# Patient Record
Sex: Female | Born: 1940 | Race: White | Hispanic: No | Smoking: Former smoker
Health system: Southern US, Community
[De-identification: ages and names within clinical notes are randomized; demographics above are authoritative.]

## PROBLEM LIST (undated history)

## (undated) DIAGNOSIS — J45909 Unspecified asthma, uncomplicated: Secondary | ICD-10-CM

## (undated) DIAGNOSIS — F419 Anxiety disorder, unspecified: Secondary | ICD-10-CM

## (undated) DIAGNOSIS — I519 Heart disease, unspecified: Secondary | ICD-10-CM

## (undated) DIAGNOSIS — T7840XA Allergy, unspecified, initial encounter: Secondary | ICD-10-CM

## (undated) DIAGNOSIS — H9319 Tinnitus, unspecified ear: Secondary | ICD-10-CM

## (undated) DIAGNOSIS — I999 Unspecified disorder of circulatory system: Secondary | ICD-10-CM

## (undated) DIAGNOSIS — I10 Essential (primary) hypertension: Secondary | ICD-10-CM

## (undated) DIAGNOSIS — G459 Transient cerebral ischemic attack, unspecified: Secondary | ICD-10-CM

## (undated) DIAGNOSIS — I4891 Unspecified atrial fibrillation: Secondary | ICD-10-CM

## (undated) DIAGNOSIS — M1990 Unspecified osteoarthritis, unspecified site: Secondary | ICD-10-CM

## (undated) HISTORY — DX: Unspecified disorder of circulatory system: I99.9

## (undated) HISTORY — DX: Heart disease, unspecified: I51.9

## (undated) HISTORY — DX: Unspecified asthma, uncomplicated: J45.909

## (undated) HISTORY — DX: Allergy, unspecified, initial encounter: T78.40XA

## (undated) HISTORY — DX: Tinnitus, unspecified ear: H93.19

## (undated) HISTORY — DX: Unspecified osteoarthritis, unspecified site: M19.90

## (undated) HISTORY — PX: VAGINAL HYSTERECTOMY: SUR661

## (undated) HISTORY — DX: Anxiety disorder, unspecified: F41.9

## (undated) HISTORY — DX: Unspecified atrial fibrillation: I48.91

---

## 2004-08-27 ENCOUNTER — Ambulatory Visit: Payer: Self-pay | Admitting: Family Medicine

## 2005-08-31 ENCOUNTER — Other Ambulatory Visit: Payer: Self-pay

## 2005-08-31 ENCOUNTER — Inpatient Hospital Stay: Payer: Self-pay | Admitting: Internal Medicine

## 2005-09-01 ENCOUNTER — Other Ambulatory Visit: Payer: Self-pay

## 2005-09-02 ENCOUNTER — Other Ambulatory Visit: Payer: Self-pay

## 2005-09-03 ENCOUNTER — Other Ambulatory Visit: Payer: Self-pay

## 2006-10-28 HISTORY — PX: OTHER SURGICAL HISTORY: SHX169

## 2009-12-28 ENCOUNTER — Ambulatory Visit: Payer: Self-pay | Admitting: Unknown Physician Specialty

## 2011-05-20 DIAGNOSIS — E785 Hyperlipidemia, unspecified: Secondary | ICD-10-CM | POA: Insufficient documentation

## 2011-05-20 DIAGNOSIS — E782 Mixed hyperlipidemia: Secondary | ICD-10-CM | POA: Insufficient documentation

## 2011-05-20 DIAGNOSIS — G459 Transient cerebral ischemic attack, unspecified: Secondary | ICD-10-CM | POA: Insufficient documentation

## 2011-05-20 DIAGNOSIS — I10 Essential (primary) hypertension: Secondary | ICD-10-CM | POA: Insufficient documentation

## 2011-05-20 DIAGNOSIS — I878 Other specified disorders of veins: Secondary | ICD-10-CM | POA: Insufficient documentation

## 2011-05-20 DIAGNOSIS — M8580 Other specified disorders of bone density and structure, unspecified site: Secondary | ICD-10-CM | POA: Insufficient documentation

## 2012-05-28 DEATH — deceased

## 2012-09-08 ENCOUNTER — Ambulatory Visit: Payer: Self-pay | Admitting: Unknown Physician Specialty

## 2012-09-14 ENCOUNTER — Ambulatory Visit: Payer: Self-pay | Admitting: Unknown Physician Specialty

## 2012-09-16 ENCOUNTER — Ambulatory Visit: Payer: Self-pay | Admitting: Unknown Physician Specialty

## 2013-09-21 ENCOUNTER — Ambulatory Visit: Payer: Self-pay | Admitting: Family Medicine

## 2013-10-19 ENCOUNTER — Ambulatory Visit: Payer: Self-pay | Admitting: Family Medicine

## 2014-05-25 DIAGNOSIS — I639 Cerebral infarction, unspecified: Secondary | ICD-10-CM | POA: Insufficient documentation

## 2014-05-30 ENCOUNTER — Ambulatory Visit: Payer: Self-pay | Admitting: Physician Assistant

## 2014-05-30 ENCOUNTER — Emergency Department: Payer: Self-pay | Admitting: Emergency Medicine

## 2014-05-30 LAB — URINALYSIS, COMPLETE
BACTERIA: NONE SEEN
BILIRUBIN, UR: NEGATIVE
BILIRUBIN, UR: NEGATIVE
BLOOD: NEGATIVE
GLUCOSE, UR: NEGATIVE mg/dL (ref 0–75)
Glucose,UR: NEGATIVE mg/dL (ref 0–75)
Ketone: NEGATIVE
Ketone: NEGATIVE
LEUKOCYTE ESTERASE: NEGATIVE
LEUKOCYTE ESTERASE: NEGATIVE
Nitrite: NEGATIVE
Nitrite: NEGATIVE
PH: 8 (ref 4.5–8.0)
Ph: 7 (ref 4.5–8.0)
Protein: 30
Protein: NEGATIVE
RBC,UR: 4 /HPF (ref 0–5)
SPECIFIC GRAVITY: 1.015 (ref 1.003–1.030)
Specific Gravity: 1.005 (ref 1.003–1.030)
WBC UR: <1 /HPF (ref 0–5)

## 2014-05-30 LAB — CBC
HCT: 48.6 % — ABNORMAL HIGH (ref 35.0–47.0)
HGB: 15.7 g/dL (ref 12.0–16.0)
MCH: 29 pg (ref 26.0–34.0)
MCHC: 32.2 g/dL (ref 32.0–36.0)
MCV: 90 fL (ref 80–100)
Platelet: 275 10*3/uL (ref 150–440)
RBC: 5.4 10*6/uL — AB (ref 3.80–5.20)
RDW: 13.5 % (ref 11.5–14.5)
WBC: 7.2 10*3/uL (ref 3.6–11.0)

## 2014-05-30 LAB — COMPREHENSIVE METABOLIC PANEL
ALK PHOS: 64 U/L
ANION GAP: 7 (ref 7–16)
Albumin: 3.6 g/dL (ref 3.4–5.0)
BILIRUBIN TOTAL: 0.4 mg/dL (ref 0.2–1.0)
BUN: 14 mg/dL (ref 7–18)
CALCIUM: 8.8 mg/dL (ref 8.5–10.1)
CHLORIDE: 105 mmol/L (ref 98–107)
CREATININE: 0.54 mg/dL — AB (ref 0.60–1.30)
Co2: 29 mmol/L (ref 21–32)
EGFR (African American): >60
Glucose: 93 mg/dL (ref 65–99)
Osmolality: 281 (ref 275–301)
POTASSIUM: 3.6 mmol/L (ref 3.5–5.1)
SGOT(AST): 23 U/L (ref 15–37)
SGPT (ALT): 30 U/L
Sodium: 141 mmol/L (ref 136–145)
TOTAL PROTEIN: 7.8 g/dL (ref 6.4–8.2)

## 2014-05-30 LAB — TROPONIN I: Troponin-I: <0.02 ng/mL

## 2014-06-01 LAB — URINE CULTURE

## 2014-08-10 DIAGNOSIS — I340 Nonrheumatic mitral (valve) insufficiency: Secondary | ICD-10-CM | POA: Insufficient documentation

## 2014-09-27 ENCOUNTER — Ambulatory Visit: Payer: Self-pay | Admitting: Nurse Practitioner

## 2014-11-29 ENCOUNTER — Ambulatory Visit: Payer: Self-pay

## 2015-01-03 DIAGNOSIS — I272 Pulmonary hypertension, unspecified: Secondary | ICD-10-CM | POA: Insufficient documentation

## 2015-01-03 DIAGNOSIS — I071 Rheumatic tricuspid insufficiency: Secondary | ICD-10-CM | POA: Insufficient documentation

## 2015-05-02 ENCOUNTER — Telehealth: Payer: Self-pay | Admitting: Gastroenterology

## 2015-05-02 NOTE — Telephone Encounter (Signed)
Colonoscopy triage °

## 2015-05-16 NOTE — Telephone Encounter (Signed)
LVM for pt to return my call.

## 2015-05-23 ENCOUNTER — Telehealth: Payer: Self-pay | Admitting: Gastroenterology

## 2015-05-23 NOTE — Telephone Encounter (Signed)
Please call patient regarding an appt or colonoscopy patient is 42 wasn't sure if she needed a colonoscopy and she also thinks she has an appt on 8/10 which she does not

## 2015-05-23 NOTE — Telephone Encounter (Signed)
Phoned pt to let her know about her appt 8-10 @ 8:45 LM on cell number and home number

## 2015-08-09 ENCOUNTER — Emergency Department: Payer: Medicare Other

## 2015-08-09 ENCOUNTER — Emergency Department
Admission: EM | Admit: 2015-08-09 | Discharge: 2015-08-09 | Disposition: A | Discharge status: Home / Self Care | Payer: Medicare Other | Attending: Emergency Medicine | Admitting: Emergency Medicine

## 2015-08-09 DIAGNOSIS — Z87891 Personal history of nicotine dependence: Secondary | ICD-10-CM | POA: Diagnosis not present

## 2015-08-09 DIAGNOSIS — E860 Dehydration: Secondary | ICD-10-CM | POA: Diagnosis not present

## 2015-08-09 DIAGNOSIS — Z79899 Other long term (current) drug therapy: Secondary | ICD-10-CM | POA: Insufficient documentation

## 2015-08-09 DIAGNOSIS — R42 Dizziness and giddiness: Secondary | ICD-10-CM | POA: Insufficient documentation

## 2015-08-09 DIAGNOSIS — I10 Essential (primary) hypertension: Secondary | ICD-10-CM | POA: Insufficient documentation

## 2015-08-09 DIAGNOSIS — R350 Frequency of micturition: Secondary | ICD-10-CM | POA: Diagnosis not present

## 2015-08-09 DIAGNOSIS — Z880 Allergy status to penicillin: Secondary | ICD-10-CM | POA: Diagnosis not present

## 2015-08-09 DIAGNOSIS — R531 Weakness: Secondary | ICD-10-CM | POA: Diagnosis present

## 2015-08-09 HISTORY — DX: Essential (primary) hypertension: I10

## 2015-08-09 HISTORY — DX: Transient cerebral ischemic attack, unspecified: G45.9

## 2015-08-09 LAB — CBC WITH DIFFERENTIAL/PLATELET
BASOS PCT: 1 %
Basophils Absolute: 0.1 10*3/uL (ref 0–0.1)
EOS ABS: 0.2 10*3/uL (ref 0–0.7)
EOS PCT: 3 %
HCT: 46.4 % (ref 35.0–47.0)
HEMOGLOBIN: 15.1 g/dL (ref 12.0–16.0)
Lymphocytes Relative: 22 %
Lymphs Abs: 1.6 10*3/uL (ref 1.0–3.6)
MCH: 28.2 pg (ref 26.0–34.0)
MCHC: 32.5 g/dL (ref 32.0–36.0)
MCV: 86.9 fL (ref 80.0–100.0)
MONOS PCT: 10 %
Monocytes Absolute: 0.7 10*3/uL (ref 0.2–0.9)
Neutro Abs: 4.6 10*3/uL (ref 1.4–6.5)
Neutrophils Relative %: 64 %
PLATELETS: 233 10*3/uL (ref 150–440)
RBC: 5.34 MIL/uL — ABNORMAL HIGH (ref 3.80–5.20)
RDW: 13.8 % (ref 11.5–14.5)
WBC: 7.1 10*3/uL (ref 3.6–11.0)

## 2015-08-09 LAB — BASIC METABOLIC PANEL
Anion gap: 8 (ref 5–15)
BUN: 20 mg/dL (ref 6–20)
CALCIUM: 8.9 mg/dL (ref 8.9–10.3)
CHLORIDE: 106 mmol/L (ref 101–111)
CO2: 25 mmol/L (ref 22–32)
CREATININE: 0.58 mg/dL (ref 0.44–1.00)
Glucose, Bld: 92 mg/dL (ref 65–99)
Potassium: 3.5 mmol/L (ref 3.5–5.1)
SODIUM: 139 mmol/L (ref 135–145)

## 2015-08-09 LAB — URINALYSIS COMPLETE WITH MICROSCOPIC (ARMC ONLY)
BILIRUBIN URINE: NEGATIVE
Glucose, UA: NEGATIVE mg/dL
KETONES UR: NEGATIVE mg/dL
Leukocytes, UA: NEGATIVE
NITRITE: NEGATIVE
Protein, ur: 100 mg/dL — AB
Specific Gravity, Urine: 1.011 (ref 1.005–1.030)
pH: 7 (ref 5.0–8.0)

## 2015-08-09 MED ORDER — SODIUM CHLORIDE 0.9 % IV BOLUS (SEPSIS)
1000.0000 mL | Freq: Once | INTRAVENOUS | Status: AC
  Administered 2015-08-09: 1000 mL via INTRAVENOUS
  Filled 2015-08-09: qty 1000

## 2015-08-09 NOTE — Discharge Instructions (Signed)
Dehydration  Dehydration is when you lose more fluids from the body than you take in. Vital organs such as the kidneys, brain, and heart cannot function without a proper amount of fluids and salt. Any loss of fluids from the body can cause dehydration.   Older adults are at a higher risk of dehydration than younger adults. As we age, our bodies are less able to conserve water and do not respond to temperature changes as well. Also, older adults do not become thirsty as easily or quickly. Because of this, older adults often do not realize they need to increase fluids to avoid dehydration.   CAUSES    Vomiting.   Diarrhea.   Excessive sweating.   Excessive urination.   Fever.   Certain medicines, such as blood pressure medicines called diuretics.   Poorly controlled blood sugars.  SIGNS AND SYMPTOMS   Mild dehydration:   Thirst.   Dry lips.   Slightly dry mouth.  Moderate dehydration:   Very dry mouth.   Sunken eyes.   Skin does not bounce back quickly when lightly pinched and released.   Dark urine and decreased urine production.   Decreased tear production.   Headache.  Severe dehydration:   Very dry mouth.   Extreme thirst.   Rapid, weak pulse (more than 100 beats per minute at rest).   Cold hands and feet.   Not able to sweat in spite of heat.   Rapid breathing.   Blue lips.   Confusion and lethargy.   Difficulty being awakened.   Minimal urine production.   No tears.  DIAGNOSIS   Your health care provider will diagnose dehydration based on your symptoms and your exam. Blood and urine tests will help confirm the diagnosis. The diagnostic evaluation should also identify the cause of dehydration.  TREATMENT   Treatment of mild or moderate dehydration can often be done at home by increasing the amount of fluids that you drink. It is best to drink small amounts of fluid more often. Drinking too much at one time can make vomiting worse. Severe dehydration needs to be treated at the hospital.  You may be given IV fluids that contain water and electrolytes.  HOME CARE INSTRUCTIONS    Ask your health care provider about specific rehydration instructions.   Drink enough fluids to keep your urine clear or pale yellow.   Drink small amounts frequently if you have nausea and vomiting.   Eat as you normally do.   Avoid:    Foods or drinks high in sugar.    Carbonated drinks.    Juice.    Extremely hot or cold fluids.    Drinks with caffeine.    Fatty, greasy foods.    Alcohol.    Tobacco.    Overeating.    Gelatin desserts.   Wash your hands well to avoid spreading bacteria and viruses.   Only take over-the-counter or prescription medicines for pain, discomfort, or fever as directed by your health care provider.   Ask your health care provider if you should continue all prescribed and over-the-counter medicines.   Keep all follow-up appointments with your health care provider.  SEEK MEDICAL CARE IF:   You have abdominal pain, and it increases or stays in one area (localizes).   You have a rash, stiff neck, or severe headache.   You are irritable, sleepy, or difficult to awaken.   You are weak, dizzy, or extremely thirsty.   You have a fever.    SEEK IMMEDIATE MEDICAL CARE IF:    You are unable to keep fluids down, or you get worse despite treatment.   You have frequent episodes of vomiting or diarrhea.   You have blood or green matter (bile) in your vomit.   You have blood in your stool, or your stool looks black and tarry.   You have not urinated in 6-8 hours, or you have only urinated a small amount of very dark urine.   You faint.  MAKE SURE YOU:    Understand these instructions.   Will watch your condition.   Will get help right away if you are not doing well or get worse.     This information is not intended to replace advice given to you by your health care provider. Make sure you discuss any questions you have with your health care provider.     Document Released: 01/04/2004 Document  Revised: 10/19/2013 Document Reviewed: 06/21/2013  Elsevier Interactive Patient Education 2016 [employer] Inc[employer]

## 2015-08-09 NOTE — ED Provider Notes (Signed)
[facility] Emergency Department Provider Note  ____________________________________________  Time seen: 10:45 AM  I have reviewed the triage vital signs and the nursing notes.   HISTORY  Chief Complaint Weakness    HPI Patricia Donaldson is a 74 y.o. female who complains of generalized weakness this morning of feeling lightheaded after eating breakfast. She noted that it seemed particularly worse with walking around. No chest pain shortness of breath headache vision changes numbness [place] or weakness. No abdominal pain or back pain. No vomiting or diarrhea. She has been having urinary frequency recently and has a history of bladder prolapse. She is been decreasing her fluid intake because she is trying to avoid having to urinate every hour or 2 particularly at night. Denies any dysuria or hematuria.     Past Medical History  Diagnosis Date  . TIA (transient ischemic attack)     2007  . Hypertension    atrial fibrillation   There are no active problems to display for this patient.    Past Surgical History  Procedure Laterality Date  . Bladder prolapse     hysterectomy   Current Outpatient Rx  Name  Route  Sig  Dispense  Refill  . Cholecalciferol (VITAMIN D3) 400 UNITS tablet   Oral   Take 400 Units by mouth 3 (three) times daily.         Marland Kitchen diltiazem (DILACOR XR) 120 MG 24 hr capsule   Oral   Take 120 mg by mouth daily.         . Multiple Vitamins-Minerals (PRESERVISION AREDS) TABS   Oral   Take 1 tablet by mouth 2 (two) times daily.         . rivaroxaban (XARELTO) 20 MG TABS tablet   Oral   Take 20 mg by mouth at bedtime.            Allergies Aspirin; Lobster; and Penicillins   No family history on file.  Social History Social History  Substance Use Topics  . Smoking status: Former Games developer  . Smokeless tobacco: None  . Alcohol Use: No    Review of Systems  Constitutional:   No fever or chills. No weight  changes Eyes:   No blurry vision or double vision.  ENT:   No sore throat. Cardiovascular:   No chest pain. Respiratory:   No dyspnea or cough. Gastrointestinal:   Negative for abdominal pain, vomiting and diarrhea.  No BRBPR or melena. Genitourinary:   Negative for dysuria, urinary retention, bloody urine, or difficulty urinating. Positive urinary frequency Musculoskeletal:   Negative for back pain. No joint swelling or pain. Skin:   Negative for rash. Neurological:   Negative for headaches, focal weakness or numbness. Positive lightheadedness Psychiatric:  No anxiety or depression.   Endocrine:  No hot/cold intolerance, changes in energy, or sleep difficulty.  10-point ROS otherwise negative.  ____________________________________________   PHYSICAL EXAM:  VITAL SIGNS: ED Triage Vitals  Enc Vitals Group     BP 08/09/15 1048 184/109 mmHg     Pulse Rate 08/09/15 1048 97     Resp 08/09/15 1048 15     Temp 08/09/15 1048 98.2 F (36.8 C)     Temp Source 08/09/15 1048 Oral     SpO2 08/09/15 1048 97 %     Weight 08/09/15 1048 166 lb (75.297 kg)     Height 08/09/15 1048  (1.626 m)     Head Cir --      Peak  Flow --      Pain Score 08/09/15 1051 0     Pain Loc --      Pain Edu? --      Excl. in GC? --      Constitutional:   Alert and oriented. Well appearing and in no distress. Eyes:   No scleral icterus. No conjunctival pallor. PERRL. EOMI ENT   Head:   Normocephalic and atraumatic.   Nose:   No congestion/rhinnorhea. No septal hematoma   Mouth/Throat:   MMM, no pharyngeal erythema. No peritonsillar mass. No uvula shift.   Neck:   No stridor. No SubQ emphysema. No meningismus. Hematological/Lymphatic/Immunilogical:   No cervical lymphadenopathy. Cardiovascular:   Irregularly irregular rhythm, rate of 80-90. Normal and symmetric distal pulses are present in all extremities. No murmurs, rubs, or gallops. Respiratory:   Normal respiratory effort without  tachypnea nor retractions. Breath sounds are clear and equal bilaterally. No wheezes/rales/rhonchi. Gastrointestinal:   Soft and nontender. No distention. There is no CVA tenderness.  No rebound, rigidity, or guarding. Genitourinary:   deferred Musculoskeletal:   Nontender with normal range of motion in all extremities. No joint effusions.  No lower extremity tenderness.  No edema. Neurologic:   Normal speech and language.  CN 2-10 normal. Motor grossly intact. No pronator drift.  Normal gait. No gross focal neurologic deficits are appreciated.  Skin:    Skin is warm, dry and intact. No rash noted.  No petechiae, purpura, or bullae. Psychiatric:   Mood and affect are normal. Speech and behavior are normal. Patient exhibits appropriate insight and judgment.  ____________________________________________    LABS (pertinent positives/negatives) (all labs ordered are listed, but only abnormal results are displayed) Labs Reviewed  URINALYSIS COMPLETEWITH MICROSCOPIC ([facility] ONLY) - Abnormal; Notable for the following:    Color, Urine STRAW (*)    APPearance CLEAR (*)    Hgb urine dipstick 1+ (*)    Protein, ur 100 (*)    Bacteria, UA RARE (*)    Squamous Epithelial / LPF 0-5 (*)    All other components within normal limits  CBC WITH DIFFERENTIAL/PLATELET - Abnormal; Notable for the following:    RBC 5.34 (*)    All other components within normal limits  BASIC METABOLIC PANEL   ____________________________________________   EKG  Interpreted by me Atrial fibrillation with a rate of 83. Normal axis intervals QRS and ST segments and T waves.  ____________________________________________    RADIOLOGY  Chest x-ray unremarkable  ____________________________________________   PROCEDURES   ____________________________________________   INITIAL IMPRESSION / ASSESSMENT AND PLAN / ED COURSE  Pertinent labs & imaging results that were available during my care of the patient were  reviewed by me and considered in my medical decision making (see chart for details).  EKG chest x-ray labs urinalysis all unremarkable. Patient feeling much better after IV fluids. Likely mild dehydration related to fluid restriction in attempt to avoid having to urinate frequently. There does not appear to be any pathologic cause for the urinary frequency and it is likely anatomic in nature. The patient reports having a total hysterectomy in the past and required a bladder mesh procedure which has since reverted back to her dysfunctional anatomy. I recommended she follow up with urogynecology who can evaluate for possible need for a bladder suspension procedure versus pharmacologic management. She'll follow up with primary care in the next few days to continue monitoring her symptoms. No evidence of sepsis ACS PE TAD stroke intracranial hemorrhage or traumatic injury.  ____________________________________________   FINAL CLINICAL IMPRESSION(S) / ED DIAGNOSES  Final diagnoses:  Urinary frequency  Dehydration, mild  Lightheaded      Sharman Cheek, MD 08/09/15 1223

## 2015-08-09 NOTE — ED Notes (Signed)
Pt presents from EMS with weakness started this morning. Pt felt like she was going to pass out, but no LOC change.

## 2015-08-09 NOTE — ED Notes (Signed)
Pt c/o of weakness after eating breakfast this morning. She states her heart was racing and she felt like she was going to pass out. Pt had no loss of consciousness. Glucose with EMS was 94. Pt was able to ambulate to bathroom. Pt has hx of TIA in 2007.

## 2015-08-31 ENCOUNTER — Encounter: Payer: Self-pay | Admitting: Physical Therapy

## 2015-08-31 ENCOUNTER — Ambulatory Visit: Payer: Medicare Other | Attending: Nurse Practitioner | Admitting: Physical Therapy

## 2015-08-31 VITALS — BP 130/88

## 2015-08-31 DIAGNOSIS — R279 Unspecified lack of coordination: Secondary | ICD-10-CM

## 2015-08-31 DIAGNOSIS — M629 Disorder of muscle, unspecified: Secondary | ICD-10-CM | POA: Insufficient documentation

## 2015-08-31 DIAGNOSIS — N8189 Other female genital prolapse: Secondary | ICD-10-CM | POA: Insufficient documentation

## 2015-08-31 NOTE — Therapy (Addendum)
[place] MAIN [facility] SERVICES [address] [place], [place], [address] Phone: [phone]   Fax:  [phone]  Physical Therapy Evaluation  Patient Details  Name: Patricia Donaldson MRN: [id number] Date of Birth: 13-Sep-1941 Referring Provider: Dayton Martes  Encounter Date: 08/31/2015      PT End of Session - 08/31/15 1106    Visit Number 1   Number of Visits 12   Date for PT Re-Evaluation 11/09/15   Authorization Type G-Code at 10th visit   Activity Tolerance Patient tolerated treatment well   Behavior During Therapy [facility] for tasks assessed/performed      Past Medical History  Diagnosis Date  . TIA (transient ischemic attack)     2007  . Hypertension   . Atrial fibrillation ([place])   . 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 Allergy   . Asthma   . Circulation problem     Past Surgical History  Procedure Laterality Date  . Bladder prolapse  2008    no f/u with PT   . Vaginal hysterectomy      cervix and uterus removed with bladder prolapse surgery     Filed Vitals:   08/31/15 1122  BP: 130/88    Visit Diagnosis:  Fascial defect - Plan: PT plan of care cert/re-cert  Lack of coordination - Plan: PT plan of care cert/re-cert  Pelvic floor weakness - Plan: PT plan of care cert/re-cert      Subjective Assessment - 08/31/15 1124    Subjective Pt reports having urinary frequency started in 2005 when she felt stressful with a move from the [facility]. Pt underwent bladder prolapse surgery in 2008 with cervical and uterus removed. Post-surgery, pt did not have physical therapy and urinary Sx did improve and was able to make it through a whole movie without having the need to urination. Currently, pt can not make it through a whole movie and continues to have frequent urination every 1-1.5 hr while nocturia occurs 3-4x/night.  Pt denied leakage. Pt continues to report  difficulty with completely emptying urine but finds Kegel squeezes to  help. Pt states  her urinary frequency worsen during another stressful event when her husband died.  Daily Fluid intake: 3-4 glass of water, no tea/ coffee, soda. Bowel movements:3x/ day. Physical activity: pt stopped exercising on treadmill after her eye surgery Feb 2016 and has not been consistent currently.       Pertinent History Hx of bladder prolapse surgery without PT, Sept 2016 episode of dehydration as Dx in the ED. Pt sustained a fall after slipping in the bathtub in Sept 2016 with a hit on her tailbone, back of her head and L siided ribs. Relaxation activities: going out to eat with friends, starting pickle ball.  Hx of 2 vaginal delieveries with stitches    Patient Stated Goals go to public places without a need to find a restroom, and decrease trips to the bathroom at night    Currently in Pain? No/denies            [facility] PT Assessment - 08/31/15 1142    Assessment   Medical Diagnosis urinary incontinence   Referring Provider Gaetano Net, NP    Precautions   Precautions None   Restrictions   Weight Bearing Restrictions No   Balance Screen   Has the patient fallen in the past 6 months Yes   How many times? 1   Has the patient had  a decrease in activity level because of a fear of falling?  No   Prior Function   Level of Independence Independent   Observation/Other Assessments   Observations slightly increased thoracic kyphosis    Other Surveys  --  52.4% PFIQ-7    Coordination   Gross Motor Movements are Fluid and Coordinated --  limited diaphragmatic excursion with abdominal straining,    Coordination and Movement Description bra worn too tight   Single Leg Stance   Comments L 3 sec, R 6 sec   Palpation   Spinal mobility increased mm tension and hypomobility at thoracic segments    SI assessment  R ASIS more posterior, L sacral torsion  coccyx medially aligned                 Pelvic Floor Special Questions - 08/31/15 1105    Diastasis Recti 3 fingers  width above umbilicus   Prolapse other bladder positioned outside introitus posteriorly with cue for coughing, bowel movement, within introitus with pillow under hips and coordiantion training   Pelvic Floor Internal Exam pt verbally consented with no contraindications verbalized upon questioning     Exam Type Vaginal   Strength weak squeeze, no lift  posterior > anterior with adductor. glut overuse          [facility] Adult PT Treatment/Exercise - 08/31/15 1105    Bed Mobility   Bed Mobility --   Self-Care   Self-Care --  importance of hydration,ways to decrease straining deep core   Other Self-Care Comments  bra strap looser, lower for improved breathing, walking routine for improved circulation  referral for tai chi    Neuro Re-ed    Neuro Re-ed Details  log rolling, pelvic floor contraction with exhalation, toileting posture and breathing, urge suppression technique                 PT Education - 08/31/15 1105    Education provided Yes   Education Details HEP   Person(s) Educated Patient   Methods Explanation;Demonstration;Tactile cues;Verbal cues;Handout   Comprehension Returned demonstration;Verbalized understanding             PT Long Term Goals - 08/31/15 1422    PT LONG TERM GOAL #1   Title Pt will decrease her UDI-6 score from 62.5% to < 50% in order improve sleep.    Time 12   Period Weeks   Status New   PT LONG TERM GOAL #2   Title Pt will decrease her PFIQ-7 score from 52.4% to < 40% in order to improve QOL.    Time 12   Period Weeks   Status New   PT LONG TERM GOAL #3   Title Pt will report sitting through a movie without a need to go urinate in order improve social life.    Time 12   Period Weeks   Status New   PT LONG TERM GOAL #4   Title Pt will be compiant with relaxation techniques across 1 week in order to show better self-management of Sx and minimize relapse of Sx.    Time 12   Period Weeks   Status New   PT LONG TERM GOAL #5   Title  Pt will demo 3/7/7/7 in order to improve pelvic floor strength in order to progress to functional activities with improved deep core strength.    Time 12   Period Weeks   Status New   Additional Long Term Goals   Additional Long Term Goals  Yes   PT LONG TERM GOAL #6   Title Pt will demo less than 2 fingers width at abdomen and ability to display lumbopelvic stability with ALSR x 2 rep.    Time 12   Period Weeks   Status New               Plan - September 09, 2015 1405    Clinical Impression Statement Pt is a 74 yo female who c/o urinary frequency . 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 These deficits impact her participation in community events and sleep quality.    Pt will benefit from skilled therapeutic intervention in order to improve on the following deficits Increased muscle spasms;Improper body mechanics;Increased fascial restricitons;Decreased coordination;Decreased range of motion;Decreased endurance;Decreased safety awareness;Decreased activity tolerance;Decreased balance;Decreased strength;Decreased mobility;Hypomobility;Impaired flexibility;Postural dysfunction   Rehab Potential Good   Clinical Impairments Affecting Rehab Potential A-fib, circulatory deficits in legs, Hx of TIA    PT Frequency 1x / week   PT Duration 12 weeks   PT Treatment/Interventions ADLs/Self Care Home Management;Traction;Gait training;Stair training;Therapeutic activities;Therapeutic exercise;Functional mobility training;Manual techniques;Patient/family education;Neuromuscular re-education;Manual lymph drainage;Scar mobilization;Passive range of motion;Energy conservation;Splinting;Taping;Other (comment)   relaxation training as pt reports urinary frequency increases  with  stress   PT Next Visit Plan assess perineal scar    Recommended Other Services community tai chi classes    Consulted and Agree with Plan of Care Patient          G-Codes - 09-09-2015 1108    Functional Assessment Tool Used 52.4% PFIQ-7    Functional Limitation Self care   Self Care Current Status (M1470) At least 40 percent but less than 60 percent impaired, limited or restricted   Self Care Goal Status (L2957) At least 20 percent but less than 40 percent impaired, limited or restricted       Problem List There are no active problems to display for this patient.   Sara Chu, DPT, E-RYT  10/02/2015, 11:08 AM  [place] MAIN [facility] SERVICES [address] [place], [place], [address] Phone: [phone]   Fax:  [phone]  Name: Patricia Donaldson MRN: [id number] Date of Birth: [birth year]

## 2015-08-31 NOTE — Patient Instructions (Addendum)
1.   Keep a consistent walking 5x/ week for cardiac health and increased circulation in legs  15 min in the morning, 15 min in the afternoon (total of 30 min per day).  Avoid long periods of walking for better cardiac health and decrease shortness of breath    2. Decrease strain on belly and pelvic floor muscles:   Log rolling (see handout)   Toileting posture and breathing (see below)   .    3. Decrease frequent urination after you have already gone within the past 30-60 min:  URGE SUPPRESION TECHNIQUES  ? These techniques are to be used to suppress those abnormally strong URGES to urinate, especially after you have already urinated < 1hr ago.  ? These steps do not have to be followed in order, and not all steps have to be used.   ? The purpose of these steps is to help you regain control of your bladder, to reduce the amount of urinary urgency, frequency, or leaking.  ? They take practice to master in controlling urgency.  Allow yourself to be okay with leaking when you first start practicing these steps. ? Practice these steps first at home, when you do not have to worry as much about leaking.  1. SIT DOWN.  Pressure on the pelvic floor inhibits the bladder.  Further pressure may help, such as sitting on a small rolled up towel. 2. LIGHT "KEGELS" using elevator imagery.  Breathe in, feel pelvic floor lower to "basement" level by the end of the inhalation. Exhale, feel pelvic floor gradually move up to "1st floor" which is the neutral position of pelvic floor. At the end of exhalation, feel pelvic floor lift higher at which you will perform a quick light squeeze (the muscles that hold back gas and urine).  Do not do hard, maximal contractions, as this will quickly fatigue the muscles and cause leakage. Perform this 5 repetitions in this pattern.  3. BREATHE & STAY CALM.  Breathing slowly and remaining calm will inhibit your sympathetic nervous system, which will in turn calm the  bladder.   4. DISTRACTION.  Sit with a project that will engage your mind.  Anything that works for you - reading, word puzzles, crochet, knitting, checking email, balancing the checkbook, and so on. 5. VISUALIZATION.  Imagine that you are in a place/situation in which either you cannot or do not want to leave.  Examples: in a car and cannot stop; lying on a beach with a far walk to a restroom; at dinner with someone special.  If the urge persists after practicing these steps and feel you must go to the bathroom, then it is imperative that you . . . . ? Walk slowly and calmly to the bathroom ? Maintain calm breathing ? Refrain from undressing until you are standing over the toilet Rushing to the restroom will only encourage the strong bladder urges and leaking.  Again, the more you practice, the easier these steps will become.  4. Wear bra looser and get an extensor for improved breathing

## 2015-09-06 ENCOUNTER — Ambulatory Visit: Payer: Medicare Other | Admitting: Physical Therapy

## 2015-09-06 DIAGNOSIS — M629 Disorder of muscle, unspecified: Secondary | ICD-10-CM

## 2015-09-06 DIAGNOSIS — N8189 Other female genital prolapse: Secondary | ICD-10-CM

## 2015-09-06 DIAGNOSIS — R279 Unspecified lack of coordination: Secondary | ICD-10-CM

## 2015-09-06 NOTE — Patient Instructions (Signed)
   Body Scan: each morning and night with pillow under knees.  Position your back in relax way (lift hips and lower one vertebra at a time. (listen the recording in your email. Or practice on your own) .   Research:  PoodleHair.ishttp://www.ncbi.nlm.nih.gov/pubmed?linkname=pubmed_pubmed&from_uid=23129105     Audio More recording:  [email or web address]://www.[email or web address]/the-mindful-[email or web address]

## 2015-09-07 NOTE — Therapy (Signed)
[facility]IN [facility] [address] [place], [place], [address] Phone: [phone]   Fax:  [phone]  Physical Therapy Treatment  Patient Details  Name: Patricia Donaldson MRN: [id number] Date of Birth: [birth year] Referring Provider: Bayard MalesGauger  Encounter Date: 09/06/2015      PT End of Session - 09/07/15 1754    Visit Number 2   Number of Visits 12   Date for PT Re-Evaluation 11/09/15   Authorization Type G-Code at 10th visit   PT Start Time 1405   PT Stop Time 1530   PT Time Calculation (min) 85 min   Activity Tolerance Patient tolerated treatment well   [facility] for tasks assessed/performed      Past Medical History  Diagnosis Date  . TIA (transient ischemic attack)     2007  . Hypertension   . Atrial fibrillation (HCC)   . 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 Allergy   . Asthma   . Circulation problem     Past Surgical History  Procedure Laterality Date  . Bladder prolapse  2008    no f/u with PT   . Vaginal hysterectomy      cervix and uterus removed with bladder prolapse surgery     There were no vitals filed for this visit.  Visit Diagnosis:  Fascial defect  Lack of coordination  Pelvic floor weakness      Subjective Assessment - 09/07/15 1752    Subjective Pt reported she bought a bra extensor, squatty potty, and a stainless steel water bottle. Pt inquired about how physiology to how her body gets woken up to urinate in the middle of the night, and if her semi reclined position can be causing downward pressur eon her pelvic floor.    Pertinent History Hx of bladder prolapse surgery without PT, Sept 2016 episode of dehydration as Dx in the ED. Pt sustained a fall after slipping in the bathtub in Sept 2016 with a hit on her tailbone, back of her head and L siided ribs. Relaxation activities: going out to eat with friends, starting pickle ball.  Hx of 2 vaginal delieveries with stitches    Patient Stated Goals go to public places without a need to find a restroom, and decrease trips to the bathroom at night                       Pelvic Floor Special Questions - 09/07/15 1749    Prolapse other bladder positioned descended within introitus in semi fowlers position ( pt's preferred sleeping position)    Pelvic Floor Internal Exam pt verbally consented with no contraindications verbalized upon questioning             [facility] Adult PT Treatment/Exercise - 09/07/15 1750    Bed Mobility   Bed Mobility --  supine position with guided relaxation    Self-Care   Other Self-Care Comments  education on nervous system on urgency, discussed impact of semi fowlers position on pelvic floor, relaxation techniques (body scan, guided relaxation) to get accustomed to sleeping on back    Neuro Re-ed    Neuro Re-ed Details  body scan and diaphragmatic breathing                 PT Education - 09/07/15 1752    Education provided Yes   Education Details HEP   Person(s) Educated Patient   Methods Explanation;Demonstration;Tactile  cues;Verbal cues;Handout   Comprehension Verbalized understanding;Returned demonstration             PT Long Term Goals - 08/31/15 1422    PT LONG TERM GOAL #1   Title Pt will decrease her UDI-6 score from 62.5% to < 50% in order improve sleep.    Time 12   Period Weeks   Status New   PT LONG TERM GOAL #2   Title Pt will decrease her PFIQ-7 score from 52.4% to < 40% in order to improve QOL.    Time 12   Period Weeks   Status New   PT LONG TERM GOAL #3   Title Pt will report sitting through a movie without a need to go urinate in order improve social life.    Time 12   Period Weeks   Status New   PT LONG TERM GOAL #4   Title Pt will be compiant with relaxation techniques across 1 week in order to show better self-management of Sx and minimize relapse of Sx.    Time 12   Period Weeks   Status New   PT LONG TERM GOAL #5   Title  Pt will demo 3/7/7/7 in order to improve pelvic floor strength in order to progress to functional activities with improved deep core strength.    Time 12   Period Weeks   Status New   Additional Long Term Goals   Additional Long Term Goals Yes   PT LONG TERM GOAL #6   Title Pt will demo less than 2 fingers width at abdomen and ability to display lumbopelvic stability with ALSR x 2 rep.    Time 12   Period Weeks   Status New               Plan - 09/07/15 1754    Clinical Impression Statement Pt required excessive education on neuroanatomy and physiology to understand how a hyperactive nervous system can impact her urinary urgency. Incorporated biopsychosocial approach to her condition as her urgency episodes occur with stressful times. Pt benefited from guided relaxation technique in supine position  with report of no urge during the ~90 min of the session and felt herself more relaxed. Pt will continue to benefit from skilled PT.    Pt will benefit from skilled therapeutic intervention in order to improve on the following deficits Increased muscle spasms;Improper body mechanics;Increased fascial restricitons;Decreased coordination;Decreased range of motion;Decreased endurance;Decreased safety awareness;Decreased activity tolerance;Decreased balance;Decreased strength;Decreased mobility;Hypomobility;Impaired flexibility;Postural dysfunction   Rehab Potential Good   Clinical Impairments Affecting Rehab Potential A-fib, circulatory deficits in legs, Hx of TIA    PT Frequency 1x / week   PT Duration 12 weeks   PT Treatment/Interventions ADLs/Self Care Home Management;Traction;Gait training;Stair training;Therapeutic activities;Therapeutic exercise;Functional mobility training;Manual techniques;Patient/family education;Neuromuscular re-education;Manual lymph drainage;Scar mobilization;Passive range of motion;Energy conservation;Splinting;Taping;Other (comment)  relaxation training as pt  reports urinary frequency increases  with  stress   PT Next Visit Plan assess perineal scar    Consulted and Agree with Plan of Care Patient        Problem List There are no active problems to display for this patient.   Elisha Ponder, DPT, E-RYT  09/07/2015, 5:59 PM  [facility] MAIN [facility] SERVICES [address] [place], [place], [address] Phone: [phone]   Fax:  [phone]  Name: Patricia Donaldson MRN: [id number] Date of Birth: October 29, 1940

## 2015-09-13 ENCOUNTER — Encounter: Payer: Medicare Other | Admitting: Physical Therapy

## 2015-09-20 ENCOUNTER — Telehealth: Payer: Self-pay | Admitting: Physical Therapy

## 2015-09-20 ENCOUNTER — Encounter: Payer: Medicare Other | Admitting: Physical Therapy

## 2015-09-20 NOTE — Telephone Encounter (Signed)
PT called pt to f/u on a message she left with front desk re: cancelling appt. Pt explained she has to hold off PT for a few weeks while she cares for her son who is town. Pt expressed interested in contining PT as it has been helping her very much. Pt and PT agreed to communicate in mid-Dec to schedule appt and resume PT.

## 2015-09-27 ENCOUNTER — Encounter: Payer: Medicare Other | Admitting: Physical Therapy

## 2015-10-04 ENCOUNTER — Ambulatory Visit: Payer: Medicare Other | Admitting: Physical Therapy

## 2015-10-11 ENCOUNTER — Encounter: Payer: Medicare Other | Admitting: Physical Therapy

## 2015-11-22 ENCOUNTER — Emergency Department
Admission: EM | Admit: 2015-11-22 | Discharge: 2015-11-22 | Disposition: A | Discharge status: Home / Self Care | Payer: Medicare Other | Attending: Emergency Medicine | Admitting: Emergency Medicine

## 2015-11-22 ENCOUNTER — Encounter: Payer: Self-pay | Admitting: Emergency Medicine

## 2015-11-22 DIAGNOSIS — I159 Secondary hypertension, unspecified: Secondary | ICD-10-CM | POA: Insufficient documentation

## 2015-11-22 DIAGNOSIS — R531 Weakness: Secondary | ICD-10-CM | POA: Diagnosis present

## 2015-11-22 DIAGNOSIS — Z880 Allergy status to penicillin: Secondary | ICD-10-CM | POA: Diagnosis not present

## 2015-11-22 DIAGNOSIS — Z79899 Other long term (current) drug therapy: Secondary | ICD-10-CM | POA: Insufficient documentation

## 2015-11-22 DIAGNOSIS — I10 Essential (primary) hypertension: Secondary | ICD-10-CM | POA: Insufficient documentation

## 2015-11-22 DIAGNOSIS — Z87891 Personal history of nicotine dependence: Secondary | ICD-10-CM | POA: Diagnosis not present

## 2015-11-22 DIAGNOSIS — R002 Palpitations: Secondary | ICD-10-CM

## 2015-11-22 DIAGNOSIS — R42 Dizziness and giddiness: Secondary | ICD-10-CM

## 2015-11-22 DIAGNOSIS — R251 Tremor, unspecified: Secondary | ICD-10-CM

## 2015-11-22 LAB — COMPREHENSIVE METABOLIC PANEL
ALT: 19 U/L (ref 14–54)
AST: 28 U/L (ref 15–41)
Albumin: 4.5 g/dL (ref 3.5–5.0)
Alkaline Phosphatase: 78 U/L (ref 38–126)
Anion gap: 10 (ref 5–15)
BUN: 13 mg/dL (ref 6–20)
CHLORIDE: 102 mmol/L (ref 101–111)
CO2: 26 mmol/L (ref 22–32)
CREATININE: 0.64 mg/dL (ref 0.44–1.00)
Calcium: 9.2 mg/dL (ref 8.9–10.3)
GFR calc Af Amer: >60 mL/min (ref >60)
Glucose, Bld: 93 mg/dL (ref 65–99)
Potassium: 4.3 mmol/L (ref 3.5–5.1)
Sodium: 138 mmol/L (ref 135–145)
Total Bilirubin: 1 mg/dL (ref 0.3–1.2)
Total Protein: 8.6 g/dL — ABNORMAL HIGH (ref 6.5–8.1)

## 2015-11-22 LAB — TROPONIN I

## 2015-11-22 LAB — URINALYSIS COMPLETE WITH MICROSCOPIC (ARMC ONLY)
BACTERIA UA: NONE SEEN
Bilirubin Urine: NEGATIVE
Glucose, UA: NEGATIVE mg/dL
KETONES UR: NEGATIVE mg/dL
Leukocytes, UA: NEGATIVE
Nitrite: NEGATIVE
PROTEIN: 30 mg/dL — AB
Specific Gravity, Urine: 1.005 (ref 1.005–1.030)
pH: 9 — ABNORMAL HIGH (ref 5.0–8.0)

## 2015-11-22 LAB — CBC
HEMATOCRIT: 47.9 % — AB (ref 35.0–47.0)
Hemoglobin: 15.8 g/dL (ref 12.0–16.0)
MCH: 28.5 pg (ref 26.0–34.0)
MCHC: 33 g/dL (ref 32.0–36.0)
MCV: 86.1 fL (ref 80.0–100.0)
PLATELETS: 273 10*3/uL (ref 150–440)
RBC: 5.57 MIL/uL — AB (ref 3.80–5.20)
RDW: 14 % (ref 11.5–14.5)
WBC: 6.9 10*3/uL (ref 3.6–11.0)

## 2015-11-22 LAB — GLUCOSE, CAPILLARY: GLUCOSE-CAPILLARY: 93 mg/dL (ref 65–99)

## 2015-11-22 MED ORDER — SODIUM CHLORIDE 0.9 % IV BOLUS (SEPSIS)
500.0000 mL | Freq: Once | INTRAVENOUS | Status: AC
  Administered 2015-11-22: 500 mL via INTRAVENOUS

## 2015-11-22 NOTE — Discharge Instructions (Signed)
Please make a follow-up appointment with Dr. Gwen Pounds.  Return to the emergency department if you develop severe pain, lightheadedness, fainting, shortness of breath, fever, or any other symptoms concerning to you.

## 2015-11-22 NOTE — ED Provider Notes (Signed)
[facility] Emergency Department Provider Note  ____________________________________________  Time seen: Approximately 11:39 AM  I have reviewed the triage vital signs and the nursing notes.   HISTORY  Chief Complaint Dizziness and Weakness    HPI Patricia Donaldson is a 75 y.o. female with a history of A. fib on several, HTN, TIA, presenting with lightheadedness and shakiness. Patient reports that she was standing in her kitchen when she developed a lightheaded feeling and shakiness, with "heart pounding.  She sat down and had an elevated blood pressure in the 180s. Her symptoms resolved after the paramedics arrived. She did not have any associated chest pain,shortness of breath, visual changes, headache, numbness tingling or weakness. She is not been having any nausea, vomiting, diarrhea or abdominal pain. For the past 3 weeks, she has been having congestion and is currently under treatment with antibiotics for a "head cold." He has not been having any fever, chills, or productive cough. Over the last several months, the patient reports that she has had difficult to control hypertension and has had multiple medication changes. Most recently, her cardiac cell was discontinued and she was placed on diltiazem but she was confused about how to take this medication so she has been completely off of any of her heart medications.   Past Medical History  Diagnosis Date  . TIA (transient ischemic attack)     2007  . Hypertension   . Atrial fibrillation (HCC)   . 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 Allergy   . Asthma   . Circulation problem     There are no active problems to display for this patient.   Past Surgical History  Procedure Laterality Date  . Bladder prolapse  2008    no f/u with PT   . Vaginal hysterectomy      cervix and uterus removed with bladder prolapse surgery     Current Outpatient Rx  Name  Route  Sig  Dispense  Refill  .  Cholecalciferol (VITAMIN D3) 400 UNITS tablet   Oral   Take 400 Units by mouth 3 (three) times daily.         Marland Kitchen diltiazem (DILACOR XR) 120 MG 24 hr capsule   Oral   Take 120 mg by mouth daily.         . Multiple Vitamins-Minerals (PRESERVISION AREDS) TABS   Oral   Take 1 tablet by mouth 2 (two) times daily.         . rivaroxaban (XARELTO) 20 MG TABS tablet   Oral   Take 20 mg by mouth at bedtime.           Allergies Aspirin; Lobster; and Penicillins  Family History  Problem Relation Age of Onset  . Cancer Mother     Social History Social History  Substance Use Topics  . Smoking status: Former Smoker -- 0.00 packs/day    Quit date: 08/31/1959  . Smokeless tobacco: None  . Alcohol Use: No    Review of Systems Constitutional: No fever/chills. Positive lightheadedness. Positive shakiness. Negative syncope. Eyes: No visual changes. ENT: No sore throat. Cardiovascular: Denies chest pain, positive palpitations. Respiratory: Denies shortness of breath.  No cough. Gastrointestinal: No abdominal pain.  No nausea, no vomiting.  No diarrhea.  No constipation. Genitourinary: Negative for dysuria. Musculoskeletal: Negative for back pain. Skin: Negative for rash. Neurological: Negative for headaches, focal weakness or numbness.  10-point ROS otherwise negative.  ____________________________________________  PHYSICAL EXAM:  VITAL SIGNS: ED Triage Vitals  Enc Vitals Group     BP 11/22/15 1003 181/116 mmHg     Pulse Rate 11/22/15 1003 84     Resp 11/22/15 1003 18     Temp 11/22/15 1003 97.5 F (36.4 C)     Temp Source 11/22/15 1003 Oral     SpO2 11/22/15 1003 98 %     Weight 11/22/15 1003 174 lb (78.926 kg)     Height 11/22/15 1003  (1.626 m)     Head Cir --      Peak Flow --      Pain Score 11/22/15 1009 0     Pain Loc --      Pain Edu? --      Excl. in GC? --     Constitutional: Alert and oriented. Well appearing and in no acute distress. Answer  question appropriately. Eyes: Conjunctivae are normal.  EOMI. Head: Atraumatic. Nose: No congestion/rhinnorhea. Mouth/Throat: Mucous membranes are moist.  Neck: No stridor.  Supple.  No JVD. Cardiovascular: Normal rate, irregular rhythm. No murmurs, rubs or gallops.  Respiratory: Normal respiratory effort.  No retractions. Lungs CTAB.  No wheezes, rales or ronchi. Gastrointestinal: Soft and nontender. No distention. No peritoneal signs. Musculoskeletal: No LE edema.  Neurologic:  Normal speech and language. No gross focal neurologic deficits are appreciated.  Skin:  Skin is warm, dry and intact. No rash noted. Psychiatric: Mood is normal, anxious affect. Speech and behavior are normal.  Normal judgement.  ____________________________________________   LABS (all labs ordered are listed, but only abnormal results are displayed)  Labs Reviewed  CBC - Abnormal; Notable for the following:    RBC 5.57 (*)    HCT 47.9 (*)    All other components within normal limits  COMPREHENSIVE METABOLIC PANEL - Abnormal; Notable for the following:    Total Protein 8.6 (*)    All other components within normal limits  URINALYSIS COMPLETEWITH MICROSCOPIC ([facility] ONLY) - Abnormal; Notable for the following:    Color, Urine STRAW (*)    APPearance CLEAR (*)    Hgb urine dipstick 1+ (*)    pH 9.0 (*)    Protein, ur 30 (*)    Squamous Epithelial / LPF 0-5 (*)    All other components within normal limits  TROPONIN I  GLUCOSE, CAPILLARY   ____________________________________________  EKG  ED ECG REPORT I, Rockne Menghini, the attending physician, personally viewed and interpreted this ECG.   Date: 11/22/2015   Rate: 80s  Rhythm: atrial fibrillation  Axis: Normal  Intervals:none  ST&T Change: No ST elevation. No ischemic changes.  ____________________________________________  RADIOLOGY  No results found.  ____________________________________________   PROCEDURES  Procedure(s)  performed: None  Critical Care performed: No ____________________________________________   INITIAL IMPRESSION / ASSESSMENT AND PLAN / ED COURSE  Pertinent labs & imaging results that were available during my care of the patient were reviewed by me and considered in my medical decision making (see chart for details).  75 y.o. female with a history of A. fib on Xarelto, resenting with an episode of lightheadedness, shakiness and heart pounding which has now resolved. The patient did not have any chest pain. Her EKG is consistent with A. fib that is rate controlled. I do not see any evidence of cardiothoracic abnormalities or neurologic abnormalities and exam. Consider transient arrhythmia that we do not EKG Here, Hypovolemia or Dehydration, Medication Side Effect Area There Is No History That Would Be Suggestive  of Acute Infection.  ----------------------------------------- 2:27 PM on 11/22/2015 -----------------------------------------  Patient has remained hemodynamically stable in the emergency department. Her EKG shows A. fib that is rate controlled. She does not have any ischemic changes. Her labs are reassuring including a negative troponin. I have talked with Dr. Richardson Dopp also keep with her cardiologist, and he will plan to see her in the office. Plan discharge. We discussed return precautions as well as follow-up instructions and the patient understands.  ____________________________________________  FINAL CLINICAL IMPRESSION(S) / ED DIAGNOSES  Final diagnoses:  Secondary hypertension, unspecified  Lightheadedness  Shakiness  Pounding heartbeat      NEW MEDICATIONS STARTED DURING THIS VISIT:  Discharge Medication List as of 11/22/2015  2:29 PM       Rockne Menghini, MD 11/22/15 1513

## 2015-11-22 NOTE — ED Notes (Signed)
Patient brought in by [facility] from home for c/o generalized weakness and dizziness that started this morning when she woke up. Patient states that she was making breakfast and started feeling weak, shaky, and felt like she was going to pass out

## 2016-01-02 DIAGNOSIS — I482 Chronic atrial fibrillation, unspecified: Secondary | ICD-10-CM | POA: Insufficient documentation

## 2016-05-29 ENCOUNTER — Encounter: Payer: Self-pay | Admitting: Urology

## 2016-05-29 ENCOUNTER — Ambulatory Visit (INDEPENDENT_AMBULATORY_CARE_PROVIDER_SITE_OTHER): Payer: Medicare Other | Admitting: Urology

## 2016-05-29 VITALS — BP 153/78 | HR 80 | Ht 64.0 in | Wt 176.9 lb

## 2016-05-29 DIAGNOSIS — N952 Postmenopausal atrophic vaginitis: Secondary | ICD-10-CM

## 2016-05-29 DIAGNOSIS — IMO0001 Reserved for inherently not codable concepts without codable children: Secondary | ICD-10-CM

## 2016-05-29 DIAGNOSIS — IMO0002 Reserved for concepts with insufficient information to code with codable children: Secondary | ICD-10-CM

## 2016-05-29 DIAGNOSIS — N8110 Cystocele, unspecified: Secondary | ICD-10-CM | POA: Diagnosis not present

## 2016-05-29 DIAGNOSIS — R350 Frequency of micturition: Secondary | ICD-10-CM | POA: Diagnosis not present

## 2016-05-29 LAB — BLADDER SCAN AMB NON-IMAGING: Scan Result: 0

## 2016-05-29 MED ORDER — ESTRADIOL 0.1 MG/GM VA CREA: TOPICAL_CREAM | VAGINAL | 12 refills | Status: DC

## 2016-05-29 NOTE — Patient Instructions (Addendum)
You are given a sample of vaginal estrogen cream (Estrace) and instructed to apply 0.5mg  (pea-sized amount)  just inside the vaginal introitus with a finger-tip every night for two weeks.  Then apply Monday, Wednesday and Friday nights.    Fesoterodine extended-release tablets What is this medicine? FESOTERODINE (fes oh TER oh deen) is used to treat overactive bladder. This medicine reduces the amount of bathroom visits. This medicine may be used for other purposes; ask your health care provider or pharmacist if you have questions. What should I tell my health care provider before I take this medicine? They need to know if you have any of these conditions: -difficulty passing urine -glaucoma -intestinal obstruction -kidney disease -liver disease -an unusual or allergic reaction to fesoterodine, tolterodine, other medicines, foods, dyes, or preservatives -pregnant or trying to get pregnant -breast-feeding How should I use this medicine? Take this medicine by mouth with a glass of water. Follow the directions on the prescription label. Do not cut, crush or chew this medicine. Take your doses at regular intervals. Do not take your medicine more often than directed. Talk to your pediatrician regarding the use of this medicine in children. Special care may be needed. Overdosage: If you think you have taken too much of this medicine contact a poison control center or emergency room at once. NOTE: This medicine is only for you. Do not share this medicine with others. What if I miss a dose? If you miss a dose, take it as soon as you can. If it is almost time for your next dose, take only that dose. Do not take double or extra doses. What may interact with this medicine? -antihistamines for allergy, cough and cold -atropine -certain medicines for bladder problems like oxybutynin, tolterodine -certain medicines for Parkinson's disease like benztropine, trihexyphenidyl -certain medicines for  stomach problems like dicyclomine, hyoscyamine -certain medicines for travel sickness like scopolamine -clarithromycin -ipratropium -itraconazole -ketoconazole -rifampin This list may not describe all possible interactions. Give your health care provider a list of all the medicines, herbs, non-prescription drugs, or dietary supplements you use. Also tell them if you smoke, drink alcohol, or use illegal drugs. Some items may interact with your medicine. What should I watch for while using this medicine? It may take 2 or 3 months to notice the full benefit from this medicine. Your health care professional may also recommend techniques that may help improve control of your bladder and sphincter muscles. These techniques will help you need the bathroom less frequently. You may need to limit your intake of tea, coffee, caffeinated sodas, and alcohol. These drinks may make your symptoms worse. Keeping healthy bowel habits may lessen bladder symptoms. If you currently smoke, quitting smoking may help reduce irritation to the bladder muscle. You may get drowsy or dizzy. Do not drive, use machinery, or do anything that needs mental alertness until you know how this drug affects you. Do not stand or sit up quickly, especially if you are an older patient. This reduces the risk of dizzy or fainting spells. Your mouth may get dry. Chewing sugarless gum or sucking hard candy and drinking plenty of water will help. This medicine may cause dry eyes and blurred vision. If you wear contact lenses you may feel some discomfort. Lubricating drops may help. See your eye doctor if the problem does not go away or is severe. What side effects may I notice from receiving this medicine? Side effects that you should report to your doctor or health care professional  as soon as possible: -allergic reactions like skin rash, itching or hives, swelling of the face, lips, or tongue -breathing problems -chest pain -fast, irregular  heartbeat -fever -swelling of the ankles, feet, hands -trouble passing urine or change in the amount of urine Side effects that usually do not require medical attention (report to your doctor or health care professional if they continue or are bothersome): -changes in vision -constipation -dizziness -dry eyes or mouth -nausea -stomach upset -tiredness This list may not describe all possible side effects. Call your doctor for medical advice about side effects. You may report side effects to FDA at 1-800-FDA-1088. Where should I keep my medicine? Keep out of the reach of children. Store at room temperature between 15 and 30 degrees C (59 and 86 degrees F). Protect from moisture. Throw away any unused medicine after the expiration date. NOTE: This sheet is a summary. It may not cover all possible information. If you have questions about this medicine, talk to your doctor, pharmacist, or health care provider.    2016, Elsevier/Gold Standard. (2009-12-13 12:25:12)

## 2016-05-29 NOTE — Progress Notes (Signed)
05/29/2016 2:53 PM   Patricia Donaldson 1941-04-05 [id number]  Referring provider: Myrene Buddy, NP [address] [facility] - PRIMARY CARE [place], [place] [address]  Chief Complaint  Patient presents with  . Urinary Frequency    referred by Patricia Donaldson [facility]  . Nocturia    HPI: Patient is a 75 year old Caucasian female who is referred by Patricia Buddy, NP for urinary frequency and nocturia.  Patient states that approximately three years after her uterine prolapse surgery, she started to develop urinary frequency and nocturia.  She is having to use the rest room every 1 to 2 hours during the day and three times at night.  She is finding it difficult to sit through a movie or a church service.  She is not experiencing urinary leakage, but she wears a pad for security.    She has not had a recent UTI's, dysuria, gross hematuria or suprapubic pain.  She has not had recent fevers, chills, nausea or vomiting.    Her PVR is 0 mL.      PMH: Past Medical History:  Diagnosis Date  . Allergy   . Anxiety   . 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 Asthma   . Atrial fibrillation (HCC)   . Circulation problem   . Heart disease   . Hypertension   . TIA (transient ischemic attack)    2007  . Tinnitus     Surgical History: Past Surgical History:  Procedure Laterality Date  . bladder prolapse  2008   no f/u with PT   . VAGINAL HYSTERECTOMY     cervix and uterus removed with bladder prolapse surgery     Home Medications:    Medication List       Accurate as of 05/29/16  2:53 PM. Always use your most recent med list.          diltiazem 120 MG 24 hr capsule Commonly known as:  DILACOR XR Take 120 mg by mouth daily.   FISH OIL PO Take by mouth.   PRESERVISION AREDS Tabs Take 1 tablet by mouth 2 (two) times daily.   RA VITAMIN B-12 TR 1000 MCG Tbcr Generic drug:  Cyanocobalamin Take by mouth.   rivaroxaban 20 MG Tabs  tablet Commonly known as:  XARELTO Take 20 mg by mouth at bedtime.   Vitamin D3 400 units tablet Take 400 Units by mouth 3 (three) times daily.       Allergies:  Allergies  Allergen Reactions  . Erythromycin Hives and Rash  . Aspirin   . Other Other (See Comments)    Medication which has discolored lower extremity/feet.  Marland Kitchen Penicillins   . Lobster [Shellfish Allergy] Hives and Rash    Family History: Family History  Problem Relation Age of Onset  . Cancer Mother     stomach  . Breast cancer Maternal Grandmother   . Kidney disease Neg Hx   . Bladder Cancer Neg Hx     Social History:  reports that she quit smoking about 56 years ago. She smoked 0.00 packs per day. She does not have any smokeless tobacco history on file. She reports that she does not drink alcohol. Her drug history is not on file.  ROS: UROLOGY Frequent Urination?: Yes Hard to postpone urination?: Yes Burning/pain with urination?: No Get up at night to urinate?: Yes Leakage of urine?: No Urine stream starts and stops?: No Trouble  starting stream?: No Do you have to strain to urinate?: No Blood in urine?: No Urinary tract infection?: No Sexually transmitted disease?: No Injury to kidneys or bladder?: No Painful intercourse?: No Weak stream?: No Currently pregnant?: No Vaginal bleeding?: No Last menstrual period?: n  Gastrointestinal Nausea?: No Vomiting?: No Indigestion/heartburn?: No Diarrhea?: No Constipation?: No  Constitutional Fever: No Night sweats?: No Weight loss?: No Fatigue?: Yes  Skin Skin rash/lesions?: Yes Itching?: No  Eyes Blurred vision?: No Double vision?: No  Ears/Nose/Throat Sore throat?: No Sinus problems?: No  Hematologic/Lymphatic Swollen glands?: No Easy bruising?: Yes  Cardiovascular Leg swelling?: Yes Chest pain?: No  Respiratory Cough?: No Shortness of breath?: Yes  Endocrine Excessive thirst?: No  Musculoskeletal Back pain?: No Joint  pain?: No  Neurological Headaches?: No Dizziness?: Yes  Psychologic Depression?: No Anxiety?: Yes  Physical Exam: BP (!) 153/78   Pulse 80   Ht 5\' 4"  (1.626 m)   Wt 176 lb 14.4 oz (80.2 kg)   BMI 30.36 kg/m   Constitutional: Well nourished. Alert and oriented, No acute distress. HEENT: [place] AT, moist mucus membranes. Trachea midline, no masses. Cardiovascular: No clubbing, cyanosis, or edema. Respiratory: Normal respiratory effort, no increased work of breathing. GI: Abdomen is soft, non tender, non distended, no abdominal masses. Liver and spleen not palpable.  No hernias appreciated.  Stool sample for occult testing is not indicated.   GU: No CVA tenderness.  No bladder fullness or masses.  Atrophic external genitalia, normal pubic hair distribution, no lesions.  Normal urethral meatus, no lesions, no prolapse, no discharge.   Urethral caruncle in noted.  No bladder fullness, tenderness or masses. Pale vagina mucosa, poor estrogen effect, no discharge, no lesions, shortened vaginal canal, Grade II cystocele is noted.  Rectocele noted.  Cervix and uterus are surgically absent.  Anus and perineum are without rashes or lesions.   Skin: No rashes, bruises or suspicious lesions. Lymph: No cervical or inguinal adenopathy. Neurologic: Grossly intact, no focal deficits, moving all 4 extremities. Psychiatric: Normal mood and affect.  Laboratory Data: Lab Results  Component Value Date   WBC 6.9 11/22/2015   HGB 15.8 11/22/2015   HCT 47.9 (H) 11/22/2015   MCV 86.1 11/22/2015   PLT 273 11/22/2015    Lab Results  Component Value Date   CREATININE 0.64 11/22/2015    Lab Results  Component Value Date   AST 28 11/22/2015   Lab Results  Component Value Date   ALT 19 11/22/2015    Pertinent Imaging: Results for Patricia, Donaldson (MRN [id number]) as of 05/29/2016 14:27  Ref. Range 05/29/2016 14:23  Scan Result Unknown 0    Assessment & Plan:    1. Urinary frequency  - offered  behavioral therapies; bladder training, bladder control strategies, pelvic floor muscle training and fluid management.    - discussed anticholinergic therapy and beta-3 andrenoceptor agonist and the potential side effects of each therapy -  - not a candidate for beta-3 andrenoceptor (Myrbetriq) due to HTN  - start trial of anticholinergic (Toviaz 4 mg), # 28 samples given  - BLADDER SCAN AMB NON-IMAGING  2. Vaginal atrophy  - given a sample of vaginal estrogen cream (Estrace) and instructed to apply 0.5mg  (pea-sized amount)  just inside the vaginal introitus with a finger-tip every night for two weeks and then Monday, Wednesday and Friday nights.  I explained to the patient that vaginally administered estrogen, which causes only a slight increase in the blood estrogen levels, have fewer contraindications and  adverse systemic effects that oral HT.  - I have also given prescriptions for the Estrace cream.   3. 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 Sherron Monday for further discussion, but she may cancel the appointment if the medication proves effective  Return for appointment with Dr. Sherron Monday.  These notes generated with voice recognition software. I apologize for typographical errors.  Michiel Cowboy, PA-C  [facility] [address] [place], [place] [address] [phone]

## 2016-06-04 ENCOUNTER — Other Ambulatory Visit: Payer: Self-pay

## 2016-06-21 ENCOUNTER — Ambulatory Visit: Payer: Medicare Other

## 2016-12-19 DIAGNOSIS — M25512 Pain in left shoulder: Secondary | ICD-10-CM | POA: Insufficient documentation

## 2016-12-19 DIAGNOSIS — M7552 Bursitis of left shoulder: Secondary | ICD-10-CM | POA: Insufficient documentation

## 2017-01-06 ENCOUNTER — Emergency Department
Admission: EM | Admit: 2017-01-06 | Discharge: 2017-01-06 | Disposition: A | Discharge status: Home / Self Care | Payer: Medicare Other | Attending: Emergency Medicine | Admitting: Emergency Medicine

## 2017-01-06 ENCOUNTER — Encounter: Payer: Self-pay | Admitting: Emergency Medicine

## 2017-01-06 DIAGNOSIS — I10 Essential (primary) hypertension: Secondary | ICD-10-CM | POA: Insufficient documentation

## 2017-01-06 DIAGNOSIS — R040 Epistaxis: Secondary | ICD-10-CM | POA: Diagnosis not present

## 2017-01-06 DIAGNOSIS — Z79899 Other long term (current) drug therapy: Secondary | ICD-10-CM | POA: Insufficient documentation

## 2017-01-06 DIAGNOSIS — J45909 Unspecified asthma, uncomplicated: Secondary | ICD-10-CM | POA: Insufficient documentation

## 2017-01-06 DIAGNOSIS — Z87891 Personal history of nicotine dependence: Secondary | ICD-10-CM | POA: Diagnosis not present

## 2017-01-06 LAB — CBC
HCT: 44.5 % (ref 35.0–47.0)
Hemoglobin: 15 g/dL (ref 12.0–16.0)
MCH: 28.8 pg (ref 26.0–34.0)
MCHC: 33.6 g/dL (ref 32.0–36.0)
MCV: 85.8 fL (ref 80.0–100.0)
PLATELETS: 271 10*3/uL (ref 150–440)
RBC: 5.19 MIL/uL (ref 3.80–5.20)
RDW: 14.2 % (ref 11.5–14.5)
WBC: 9.2 10*3/uL (ref 3.6–11.0)

## 2017-01-06 MED ORDER — LIDOCAINE HCL (PF) 1 % IJ SOLN
INTRAMUSCULAR | Status: AC
  Filled 2017-01-06: qty 5

## 2017-01-06 MED ORDER — SODIUM CHLORIDE 0.9 % IV BOLUS (SEPSIS)
500.0000 mL | Freq: Once | INTRAVENOUS | Status: AC
  Administered 2017-01-06: 500 mL via INTRAVENOUS

## 2017-01-06 MED ORDER — CEPHALEXIN 500 MG PO CAPS: 500.0000 mg | ORAL_CAPSULE | Freq: Four times a day (QID) | ORAL | 0 refills | Status: AC

## 2017-01-06 MED ORDER — OXYMETAZOLINE HCL 0.05 % NA SOLN
NASAL | Status: AC
  Administered 2017-01-06: 07:00:00
  Filled 2017-01-06: qty 15

## 2017-01-06 NOTE — Discharge Instructions (Signed)
Please follow up with ENT for further evaluation of nose bleed

## 2017-01-06 NOTE — ED Notes (Signed)
AAOx3.  Skin warm and dry.  NAD 

## 2017-01-06 NOTE — ED Triage Notes (Addendum)
Pt assisted to wheelchair upon arrival; says about 0230 she woke with a nosebleed; nasal clip placed at stat registration; pt says just bleeding from the right nostril at first, but now bleeding from both; says she had bleeding for about 30 minutes on Sunday while she was out shopping but she was able to stop it; pt denies any pain or injury

## 2017-01-06 NOTE — ED Provider Notes (Signed)
[name] Emergency Department Provider Note   ____________________________________________   First MD Initiated Contact with Patient 01/06/17 [phone]     (approximate)  I have reviewed the triage vital signs and the nursing notes.   HISTORY  Chief Complaint Epistaxis    HPI Patricia Donaldson is a 76 y.o. female who comes into the hospital today with a nosebleed. The patient reports that she came from shopping earlier this afternoon and her nose started bleeding. It stopped after she put some tissue up her nose. She woke up around 2:30 with dripping and bleeding again from her nose. She reports that has not stopped since then.The patient reports that she's put tissue in her nose and she's held her nose. The patient is on Xarelto for atrial fibrillation. She's never had like this before. The bleeding started from her right nostril but started coming out of her left as well as it has been bleeding. The patient came into the hospital today for evaluation.   Past Medical History:  Diagnosis Date  . Allergy   . Anxiety   . 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 Asthma   . Atrial fibrillation (HCC)   . Circulation problem   . Heart disease   . Hypertension   . TIA (transient ischemic attack)    2007  . Tinnitus     There are no active problems to display for this patient.   Past Surgical History:  Procedure Laterality Date  . bladder prolapse  2008   no f/u with PT   . VAGINAL HYSTERECTOMY     cervix and uterus removed with bladder prolapse surgery     Prior to Admission medications   Medication Sig Start Date End Date Taking? Authorizing Provider  Cholecalciferol (VITAMIN D3) 400 UNITS tablet Take 400 Units by mouth 3 (three) times daily.   Yes Historical Provider, MD  diltiazem (DILACOR XR) 120 MG 24 hr capsule Take 120 mg by mouth daily.   Yes Historical Provider, MD  Multiple Vitamins-Minerals (PRESERVISION AREDS) TABS Take 1 tablet by mouth 2  (two) times daily.   Yes Historical Provider, MD  rivaroxaban (XARELTO) 20 MG TABS tablet Take 20 mg by mouth at bedtime.   Yes Historical Provider, MD  cephALEXin (KEFLEX) 500 MG capsule Take 1 capsule (500 mg total) by mouth 4 (four) times daily. 01/06/17 01/16/17  Rebecka Apley, MD    Allergies Erythromycin; Aspirin; Other; Penicillins; and Lobster [shellfish allergy]  Family History  Problem Relation Age of Onset  . Cancer Mother     stomach  . Breast cancer Maternal Grandmother   . Kidney disease Neg Hx   . Bladder Cancer Neg Hx     Social History Social History  Substance Use Topics  . Smoking status: Former Smoker    Packs/day: 0.00    Quit date: 08/31/1959  . Smokeless tobacco: Never Used  . Alcohol use No    Review of Systems Constitutional: No fever/chills Eyes: No visual changes. ENT: Nosebleed Cardiovascular: Denies chest pain. Respiratory: Denies shortness of breath. Gastrointestinal: No abdominal pain.  No nausea, no vomiting.  No diarrhea.  No constipation. Genitourinary: Negative for dysuria. Musculoskeletal: Negative for back pain. Skin: Negative for rash. Neurological: Negative for headaches, focal weakness or numbness.  10-point ROS otherwise negative.  ____________________________________________   PHYSICAL EXAM:  VITAL SIGNS: ED Triage Vitals [01/06/17 0556]  Enc Vitals Group     BP (!) 191/129  Pulse Rate (!) 115     Resp 20     Temp 98 F (36.7 C)     Temp Source Oral     SpO2 97 %     Weight 173 lb (78.5 kg)     Height 5\' 3"  (1.6 m)     Head Circumference      Peak Flow      Pain Score 0     Pain Loc      Pain Edu?      Excl. in GC?     Constitutional: Alert and oriented. Well appearing and in mild distress. Eyes: Conjunctivae are normal. PERRL. EOMI. Head: Atraumatic. Nose: blood and clot in right nostril Mouth/Throat: Mucous membranes are moist.  Oropharynx non-erythematous. Cardiovascular: Tachycardia with  irregularly irregular rhythm Grossly normal heart sounds.  Good peripheral circulation. Respiratory: Normal respiratory effort.  No retractions. Lungs CTAB. Gastrointestinal: Soft and nontender. No distention. Positive bowel sounds Musculoskeletal: No lower extremity tenderness nor edema.   Neurologic:  Normal speech and language.  Skin:  Skin is warm, dry and intact.  Psychiatric: Mood and affect are normal.   ____________________________________________   LABS (all labs ordered are listed, but only abnormal results are displayed)  Labs Reviewed  CBC   ____________________________________________  EKG  ED ECG REPORT I, Rebecka ApleyWebster,  Allison P, the attending physician, personally viewed and interpreted this ECG.   Date: 01/06/2017  EKG Time: 0604  Rate: 110  Rhythm: atrial fibrillation, rate 110  Axis: normal  Intervals:none  ST&T Change: none  ____________________________________________  RADIOLOGY  none ____________________________________________   PROCEDURES  Procedure(s) performed: None  .Epistaxis Management Date/Time: 01/06/2017 6:10 AM Performed by: Rebecka ApleyWEBSTER, ALLISON P Authorized by: Rebecka ApleyWEBSTER, ALLISON P   Consent:    Consent obtained:  Verbal Anesthesia (see MAR for exact dosages):    Anesthesia method:  Topical application   Topical anesthesia: lidocaine 1% Procedure details:    Treatment site:  Unable to specify   Treatment method:  Merocel sponge   Treatment complexity:  Limited   Treatment episode: initial   Post-procedure details:    Assessment:  Bleeding stopped   Patient tolerance of procedure:  Tolerated well, no immediate complications    Critical Care performed: No  ____________________________________________   INITIAL IMPRESSION / ASSESSMENT AND PLAN / ED COURSE  Pertinent labs & imaging results that were available during my care of the patient were reviewed by me and considered in my medical decision making (see chart for  details).  This is a 76 year old female who comes into the hospital today with epistaxis. The patient does take a blood thinner. I did pack the patient's nostril given her bleeding as well as her tachycardia hypertension. We'll check some blood work and give the patient some fluid and some medication for her blood pressure.     The patient's blood pressure is improved. The patient's heart rate is also improved. I did have the patient take her diltiazem. The patient will be discharged to home to follow up with ENT. Her blood pressure is improved ____________________________________________   FINAL CLINICAL IMPRESSION(S) / ED DIAGNOSES  Final diagnoses:  Epistaxis      NEW MEDICATIONS STARTED DURING THIS VISIT:  New Prescriptions   CEPHALEXIN (KEFLEX) 500 MG CAPSULE    Take 1 capsule (500 mg total) by mouth 4 (four) times daily.     Note:  This document was prepared using Dragon voice recognition software and may include unintentional dictation errors.    Revonda StandardAllison  Catalina Gravel, MD 01/06/17 [phone]

## 2017-01-13 DIAGNOSIS — R040 Epistaxis: Secondary | ICD-10-CM | POA: Insufficient documentation

## 2017-03-10 ENCOUNTER — Encounter: Payer: Self-pay | Admitting: Urology

## 2017-03-10 ENCOUNTER — Ambulatory Visit (INDEPENDENT_AMBULATORY_CARE_PROVIDER_SITE_OTHER): Payer: Medicare Other | Admitting: Urology

## 2017-03-10 VITALS — BP 135/82 | HR 121 | Ht 63.0 in | Wt 173.0 lb

## 2017-03-10 DIAGNOSIS — E079 Disorder of thyroid, unspecified: Secondary | ICD-10-CM | POA: Insufficient documentation

## 2017-03-10 DIAGNOSIS — R351 Nocturia: Secondary | ICD-10-CM | POA: Diagnosis not present

## 2017-03-10 NOTE — Progress Notes (Signed)
03/10/2017 11:11 AM   Athena Masse 11-01-1940 [id number]  Referring provider: Myrene Buddy, NP [address] [place], [place] [address]  Chief Complaint  Patient presents with  . Urinary Frequency    HPI: Carollee Herter:  Patient had frequency and a grade 2 cystocele and was given Toviaz without follow-up. She was given estrogen cream for vaginal atrophy.  Today The patient is primarily here for nocturia. She gets up 4-5 times a night. She has little to no ankle edema. She has no history of heart failure. Recently she had a nosebleed from high blood pressure and has high blood pressure issues. She has no history of chronic renal insufficiency and does not take prednisone or Lasix.She does not drink a lot of fluids.  At baseline she reports a good flow and voids every 1 or 2 hour during the day. She wears a pad for confidence and is otherwise continent. If she holds it too long she rarely can leak a little bit with urgency. She has had a TIA. She describes a hysterectomy and bladder suspension in prolapse surgery   Modifying factors: There are no other modifying factors  Associated signs and symptoms: There are no other associated signs and symptoms Aggravating and relieving factors: There are no other aggravating or relieving factors Severity: Moderate Duration: Persistent   PMH: Past Medical History:  Diagnosis Date  . Allergy   . Anxiety   . 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 Asthma   . Atrial fibrillation (HCC)   . Circulation problem   . Heart disease   . Hypertension   . TIA (transient ischemic attack)    2007  . Tinnitus     Surgical History: Past Surgical History:  Procedure Laterality Date  . bladder prolapse  2008   no f/u with PT   . VAGINAL HYSTERECTOMY     cervix and uterus removed with bladder prolapse surgery     Home Medications:  Allergies as of 03/10/2017      Reactions   Erythromycin Hives, Rash   Aspirin    Other Other (See  Comments)   Medication which has discolored lower extremity/feet.   Penicillins    Lobster [shellfish Allergy] Hives, Rash      Medication List       Accurate as of 03/10/17 11:11 AM. Always use your most recent med list.          diltiazem 120 MG 24 hr capsule Commonly known as:  DILACOR XR Take 120 mg by mouth daily.   losartan-hydrochlorothiazide 50-12.5 MG tablet Commonly known as:  HYZAAR   PRESERVISION AREDS Tabs Take 1 tablet by mouth 2 (two) times daily.   rivaroxaban 20 MG Tabs tablet Commonly known as:  XARELTO Take 20 mg by mouth at bedtime.   rosuvastatin 5 MG tablet Commonly known as:  CRESTOR   Vitamin D3 400 units tablet Take 400 Units by mouth 3 (three) times daily.       Allergies:  Allergies  Allergen Reactions  . Erythromycin Hives and Rash  . Aspirin   . Other Other (See Comments)    Medication which has discolored lower extremity/feet.  Marland Kitchen Penicillins   . Lobster [Shellfish Allergy] Hives and Rash    Family History: Family History  Problem Relation Age of Onset  . Cancer Mother        stomach  . Breast cancer Maternal Grandmother   . Kidney disease Neg Hx   .  Bladder Cancer Neg Hx     Social History:  reports that she quit smoking about 57 years ago. She smoked 0.00 packs per day. She has never used smokeless tobacco. She reports that she does not drink alcohol. Her drug history is not on file.  ROS: UROLOGY Frequent Urination?: Yes Hard to postpone urination?: Yes Burning/pain with urination?: No Get up at night to urinate?: Yes Leakage of urine?: Yes Urine stream starts and stops?: Yes Trouble starting stream?: No Do you have to strain to urinate?: No Blood in urine?: No Urinary tract infection?: No Sexually transmitted disease?: No Injury to kidneys or bladder?: No Painful intercourse?: No Weak stream?: No Currently pregnant?: No Vaginal bleeding?: No Last menstrual period?: n  Gastrointestinal Nausea?:  No Vomiting?: No Indigestion/heartburn?: No Diarrhea?: No Constipation?: No  Constitutional Fever: No Night sweats?: No Weight loss?: No Fatigue?: No  Skin Skin rash/lesions?: Yes Itching?: No  Eyes Blurred vision?: No Double vision?: Yes  Ears/Nose/Throat Sore throat?: No Sinus problems?: No  Hematologic/Lymphatic Swollen glands?: No Easy bruising?: Yes  Cardiovascular Leg swelling?: No Chest pain?: No  Respiratory Cough?: No Shortness of breath?: Yes  Endocrine Excessive thirst?: No  Musculoskeletal Back pain?: No Joint pain?: No  Neurological Headaches?: No Dizziness?: No  Psychologic Depression?: No Anxiety?: Yes  Physical Exam: BP 135/82   Pulse (!) 121   Ht 5\' 3"  (1.6 m)   Wt 78.5 kg (173 lb)   BMI 30.65 kg/m   Constitutional:  Alert and oriented, No acute distress. HEENT: [place] AT, moist mucus membranes.  Trachea midline, no masses. Cardiovascular: No clubbing, cyanosis, or edema. Respiratory: Normal respiratory effort, no increased work of breathing. GI: Abdomen is soft, nontender, nondistended, no abdominal masses GU: No CVA tenderness. Small cystocele and small rectocele. She only has about 4 cm of vaginal length or perhaps 5 cm. She had no stress incontinence Skin: No rashes, bruises or suspicious lesions. Lymph: No cervical or inguinal adenopathy. Neurologic: Grossly intact, no focal deficits, moving all 4 extremities. Psychiatric: Normal mood and affect.  Laboratory Data: Lab Results  Component Value Date   WBC 9.2 01/06/2017   HGB 15.0 01/06/2017   HCT 44.5 01/06/2017   MCV 85.8 01/06/2017   PLT 271 01/06/2017    Lab Results  Component Value Date   CREATININE 0.64 11/22/2015    No results found for: PSA  No results found for: TESTOSTERONE  No results found for: HGBA1C  Urinalysis    Component Value Date/Time   COLORURINE STRAW (A) 11/22/2015 1006   APPEARANCEUR CLEAR (A) 11/22/2015 1006   APPEARANCEUR Clear  05/30/2014 1135   LABSPEC 1.005 11/22/2015 1006   LABSPEC 1.005 05/30/2014 1135   PHURINE 9.0 (H) 11/22/2015 1006   GLUCOSEU NEGATIVE 11/22/2015 1006   GLUCOSEU Negative 05/30/2014 1135   HGBUR 1+ (A) 11/22/2015 1006   BILIRUBINUR NEGATIVE 11/22/2015 1006   BILIRUBINUR Negative 05/30/2014 1135   KETONESUR NEGATIVE 11/22/2015 1006   PROTEINUR 30 (A) 11/22/2015 1006   NITRITE NEGATIVE 11/22/2015 1006   LEUKOCYTESUR NEGATIVE 11/22/2015 1006   LEUKOCYTESUR Negative 05/30/2014 1135    Pertinent Imaging: none  Assessment & Plan:  The patient primarily has nighttime frequency. She has frequency during the day and rare urge incontinence. The patient will be assessed by a voiding diary. I gave her 5 weeks of the beta 3 agonists and she has failed Toviaz. We will proceed accordingly. Reassess in a month  There are no diagnoses linked to this encounter.  No Follow-up  on file.  Reece Packer, MD  [facility] [address] [place], [place] [address] [phone]

## 2017-04-07 ENCOUNTER — Ambulatory Visit: Payer: Medicare Other

## 2017-04-11 ENCOUNTER — Ambulatory Visit (INDEPENDENT_AMBULATORY_CARE_PROVIDER_SITE_OTHER): Payer: Medicare Other | Admitting: Urology

## 2017-04-11 ENCOUNTER — Other Ambulatory Visit: Payer: Self-pay | Admitting: Rheumatology

## 2017-04-11 ENCOUNTER — Encounter: Payer: Self-pay | Admitting: Urology

## 2017-04-11 VITALS — BP 135/80 | HR 93 | Ht 63.0 in | Wt 174.0 lb

## 2017-04-11 DIAGNOSIS — M25512 Pain in left shoulder: Principal | ICD-10-CM

## 2017-04-11 DIAGNOSIS — R350 Frequency of micturition: Secondary | ICD-10-CM

## 2017-04-11 DIAGNOSIS — R351 Nocturia: Secondary | ICD-10-CM | POA: Diagnosis not present

## 2017-04-11 DIAGNOSIS — G8929 Other chronic pain: Secondary | ICD-10-CM

## 2017-04-11 NOTE — Progress Notes (Signed)
04/11/2017 2:39 PM   Athena MasseBonnie M Attia September 12, 1941 [id number]  Referring provider: Myrene BuddyGauger, Sarah Kathryn, NP [address] [place], [place] [id number]  Chief Complaint  Patient presents with  . Nocturia    4wk    HPI: 76 year old patient of Dr. Sherron MondayMacDiarmid who presents today for second opinion. She has history of a small cystocele as well as nocturia urgency and urge incontinence.  She is previously failed Toviaz. At her last appointment with Dr. Sherron MondayMacDiarmid, she was given 4 weeks and Mybetriq. She did not take this medication because she has concerns about her blood pressure. Her blood pressure today is quite good, 135/80.  She does bring with her a voiding diary which was performed on no medications. It does appear that she got up 1-3x nightly.  Urinary volumes range from 100 cc to 300 cc. She did also get up several times over the night to have bowel movements.   She reports that in general, she doesn't have an issue with bowel incontinence or loose stool. Over the last couple weeks, this has been worse for unclear reasons. She denies any history of constipation.   PMH: Past Medical History:  Diagnosis Date  . Allergy   . Anxiety   . 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 Asthma   . Atrial fibrillation (HCC)   . Circulation problem   . Heart disease   . Hypertension   . TIA (transient ischemic attack)    2007  . Tinnitus     Surgical History: Past Surgical History:  Procedure Laterality Date  . bladder prolapse  2008   no f/u with PT   . VAGINAL HYSTERECTOMY     cervix and uterus removed with bladder prolapse surgery     Home Medications:  Allergies as of 04/11/2017      Reactions   Erythromycin Hives, Rash   Aspirin    Other Other (See Comments)   Medication which has discolored lower extremity/feet.   Penicillins    Lobster [shellfish Allergy] Hives, Rash      Medication List       Accurate as of 04/11/17 11:59 PM. Always use your most recent med list.            diltiazem 120 MG 24 hr capsule Commonly known as:  DILACOR XR Take 120 mg by mouth daily.   losartan-hydrochlorothiazide 50-12.5 MG tablet Commonly known as:  HYZAAR   predniSONE 5 MG (21) Tbpk tablet Commonly known as:  STERAPRED UNI-PAK 21 TAB   PRESERVISION AREDS Tabs Take 1 tablet by mouth 2 (two) times daily.   rivaroxaban 20 MG Tabs tablet Commonly known as:  XARELTO Take 20 mg by mouth at bedtime.   rosuvastatin 5 MG tablet Commonly known as:  CRESTOR   Vitamin D3 400 units tablet Take 400 Units by mouth 3 (three) times daily.       Allergies:  Allergies  Allergen Reactions  . Erythromycin Hives and Rash  . Aspirin   . Other Other (See Comments)    Medication which has discolored lower extremity/feet.  Marland Kitchen. Penicillins   . Lobster [Shellfish Allergy] Hives and Rash    Family History: Family History  Problem Relation Age of Onset  . Cancer Mother        stomach  . Breast cancer Maternal Grandmother   . Kidney disease Neg Hx   . Bladder Cancer Neg Hx     Social History:  reports that she  quit smoking about 57 years ago. She smoked 0.00 packs per day. She has never used smokeless tobacco. She reports that she does not drink alcohol. Her drug history is not on file.  ROS: UROLOGY Frequent Urination?: Yes Hard to postpone urination?: Yes Burning/pain with urination?: No Get up at night to urinate?: Yes Leakage of urine?: No Urine stream starts and stops?: Yes Trouble starting stream?: No Do you have to strain to urinate?: No Blood in urine?: No Urinary tract infection?: No Sexually transmitted disease?: No Injury to kidneys or bladder?: No Painful intercourse?: No Weak stream?: No Currently pregnant?: No Vaginal bleeding?: No Last menstrual period?: n  Gastrointestinal Nausea?: No Vomiting?: No Indigestion/heartburn?: No Diarrhea?: No Constipation?: No  Constitutional Fever: No Night sweats?: No Weight loss?: No Fatigue?:  Yes  Skin Skin rash/lesions?: No Itching?: No  Eyes Blurred vision?: No Double vision?: Yes  Ears/Nose/Throat Sore throat?: No Sinus problems?: No  Hematologic/Lymphatic Swollen glands?: No Easy bruising?: Yes  Cardiovascular Leg swelling?: No Chest pain?: No  Respiratory Cough?: No Shortness of breath?: Yes  Endocrine Excessive thirst?: No  Musculoskeletal Back pain?: No Joint pain?: Yes  Neurological Headaches?: No Dizziness?: No  Psychologic Depression?: No Anxiety?: Yes  Physical Exam: BP 135/80   Pulse 93   Ht 5\' 3"  (1.6 m)   Wt 174 lb (78.9 kg)   BMI 30.82 kg/m   Constitutional:  Alert and oriented, No acute distress. HEENT: [place] AT, moist mucus membranes.  Trachea midline, no masses. Cardiovascular: No clubbing, cyanosis, or edema. Respiratory: Normal respiratory effort, no increased work of breathing. GI: Abdomen is soft, nontender, nondistended, no abdominal masses GU: No CVA tenderness.  Skin: No rashes, bruises or suspicious lesions. Neurologic: Grossly intact, no focal deficits, moving all 4 extremities. Psychiatric: Normal mood and affect.  Laboratory Data: Lab Results  Component Value Date   WBC 9.2 01/06/2017   HGB 15.0 01/06/2017   HCT 44.5 01/06/2017   MCV 85.8 01/06/2017   PLT 271 01/06/2017    Lab Results  Component Value Date   CREATININE 0.64 11/22/2015   Urinalysis N/a  Pertinent Imaging: n/a  Assessment & Plan:    1. Urinary frequency Recommend trial of Mybetriq as previously recommended, explained blood pressure rise is only approximately 5 mmHg systolically and pacesetter blood pressure today, she should tolerate this.  She hand out of algorithm given today, understands that mass trial multiple medications prior to refractory treatment options.  2. Nocturia Voiding diary reviewed, does have urinary frequency at night, borderline meets criteria for nocturia. We'll address overactivity first and see if this  improves.  3. Diarrhea Multiple loose stools including in the middle of the night, discussed that bowel and bladder issues are often related, advised to start stool softener and address loose stool with PCP.   There are no diagnoses linked to this encounter.  Return in about 1 month (around 05/11/2017) for symptoms recheck, BP, PVR.  Vanna Scotland, MD  [facility] [address], [address] [place], [place] [address] [phone]

## 2017-04-26 ENCOUNTER — Other Ambulatory Visit: Payer: Medicare Other

## 2017-05-09 ENCOUNTER — Encounter: Payer: Self-pay | Admitting: Urology

## 2017-05-09 ENCOUNTER — Ambulatory Visit (INDEPENDENT_AMBULATORY_CARE_PROVIDER_SITE_OTHER): Payer: Medicare Other | Admitting: Urology

## 2017-05-09 VITALS — BP 117/71 | HR 82 | Ht 63.0 in | Wt 180.0 lb

## 2017-05-09 DIAGNOSIS — R351 Nocturia: Secondary | ICD-10-CM

## 2017-05-09 DIAGNOSIS — R350 Frequency of micturition: Secondary | ICD-10-CM

## 2017-05-09 LAB — BLADDER SCAN AMB NON-IMAGING

## 2017-05-09 MED ORDER — MIRABEGRON ER 50 MG PO TB24: 50.0000 mg | ORAL_TABLET | Freq: Every day | ORAL | 11 refills | Status: DC

## 2017-05-09 NOTE — Progress Notes (Signed)
05/09/2017 5:53 PM   Patricia Donaldson 1941-04-06 [id number]  Referring provider: Myrene Buddy, NP [address] [place], [place] [address]  Chief Complaint  Patient presents with  . Urinary Frequency    1 month    HPI: 76 year old patient with a history of a small cystocele as well as nocturia urgency and urge incontinence.  She is previously failed Toviaz.  At last visit, she was given additional samples of Mybetriq 25 mg which she has taken in the interim.     She reports that she has had some mild improvement in her symptoms. She is less severe urgency and has a little more time to get to the bathroom.  She has a repeat voiding diary today on medications. It appears that her urgency scores have lessened. Her voided volumes are 4 anywhere from 100 cc to 300 cc. It looks like she got up only once in the middle of the night to urinate.   She denies any history of constipation.  He continues to wake up with loose stools at night which she wonders as a driving factor for her nocturia.  PMH: Past Medical History:  Diagnosis Date  . Allergy   . Anxiety   . 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 Asthma   . Atrial fibrillation (HCC)   . Circulation problem   . Heart disease   . Hypertension   . TIA (transient ischemic attack)    2007  . Tinnitus     Surgical History: Past Surgical History:  Procedure Laterality Date  . bladder prolapse  2008   no f/u with PT   . VAGINAL HYSTERECTOMY     cervix and uterus removed with bladder prolapse surgery     Home Medications:  Allergies as of 05/09/2017      Reactions   Erythromycin Hives, Rash   Aspirin    Other Other (See Comments)   Medication which has discolored lower extremity/feet.   Penicillins    Lobster [shellfish Allergy] Hives, Rash      Medication List       Accurate as of 05/09/17  5:53 PM. Always use your most recent med list.          diltiazem 120 MG 24 hr capsule Commonly known as:  DILACOR  XR Take 120 mg by mouth daily.   losartan-hydrochlorothiazide 50-12.5 MG tablet Commonly known as:  HYZAAR   mirabegron ER 50 MG Tb24 tablet Commonly known as:  MYRBETRIQ Take 1 tablet (50 mg total) by mouth daily.   rivaroxaban 20 MG Tabs tablet Commonly known as:  XARELTO Take 20 mg by mouth at bedtime.   rosuvastatin 5 MG tablet Commonly known as:  CRESTOR   Vitamin D3 400 units tablet Take 400 Units by mouth 3 (three) times daily.       Allergies:  Allergies  Allergen Reactions  . Erythromycin Hives and Rash  . Aspirin   . Other Other (See Comments)    Medication which has discolored lower extremity/feet.  Marland Kitchen Penicillins   . Lobster [Shellfish Allergy] Hives and Rash    Family History: Family History  Problem Relation Age of Onset  . Cancer Mother        stomach  . Breast cancer Maternal Grandmother   . Kidney disease Neg Hx   . Bladder Cancer Neg Hx     Social History:  reports that she quit smoking about 57 years ago. She smoked 0.00  packs per day. She has never used smokeless tobacco. She reports that she does not drink alcohol. Her drug history is not on file.  ROS: UROLOGY Frequent Urination?: Yes Hard to postpone urination?: No Burning/pain with urination?: No Get up at night to urinate?: Yes Leakage of urine?: No Urine stream starts and stops?: Yes Trouble starting stream?: No Do you have to strain to urinate?: No Blood in urine?: No Urinary tract infection?: No Sexually transmitted disease?: No Injury to kidneys or bladder?: No Painful intercourse?: No Weak stream?: No Currently pregnant?: No Vaginal bleeding?: No Last menstrual period?: n  Gastrointestinal Nausea?: No Vomiting?: No Indigestion/heartburn?: No Diarrhea?: No Constipation?: No  Constitutional Fever: No Night sweats?: No Weight loss?: No Fatigue?: Yes  Skin Skin rash/lesions?: No Itching?: No  Eyes Blurred vision?: No Double vision?:  Yes  Ears/Nose/Throat Sore throat?: No Sinus problems?: No  Hematologic/Lymphatic Swollen glands?: No Easy bruising?: Yes  Cardiovascular Leg swelling?: Yes Chest pain?: No  Respiratory Cough?: No Shortness of breath?: Yes  Endocrine Excessive thirst?: No  Musculoskeletal Back pain?: Yes Joint pain?: Yes  Neurological Headaches?: No Dizziness?: No  Psychologic Depression?: No Anxiety?: Yes  Physical Exam: BP 117/71   Pulse 82   Ht 5\' 3"  (1.6 m)   Wt 180 lb (81.6 kg)   BMI 31.89 kg/m   Constitutional:  Alert and oriented, No acute distress. HEENT: [place] AT, moist mucus membranes.  Trachea midline, no masses. Cardiovascular: No clubbing, cyanosis, or edema. Respiratory: Normal respiratory effort, no increased work of breathing. GI: Abdomen is soft, nontender, nondistended, no abdominal masses GU: No CVA tenderness.  Skin: No rashes, bruises or suspicious lesions. Neurologic: Grossly intact, no focal deficits, moving all 4 extremities. Psychiatric: Normal mood and affect.  Laboratory Data: Lab Results  Component Value Date   WBC 9.2 01/06/2017   HGB 15.0 01/06/2017   HCT 44.5 01/06/2017   MCV 85.8 01/06/2017   PLT 271 01/06/2017    Lab Results  Component Value Date   CREATININE 0.64 11/22/2015   Urinalysis N/a  Pertinent Imaging: n/a  Assessment & Plan:    1. Urinary frequency Some improvement with Mybetriq 25 mg, will optimize dose to 50 mg Blood pressure excellent today At this point, she'll follow up as needed- if symptoms become worse or fail to meet her satisfaction, she will return to discuss alternatives  2. Nocturia No nocturnal polyuria on voiding diary today  3. Diarrhea Addressing loose stools and nocturnal defecation with PCP  F/u prn   Vanna ScotlandAshley Taneah Masri, MD  [facility] [address], [address] [place], [place] [address] [phone]

## 2017-05-19 ENCOUNTER — Telehealth: Payer: Self-pay | Admitting: Urology

## 2017-05-19 NOTE — Telephone Encounter (Signed)
Pt cannot afford Myrbetriq 50 mg.  She would like to know if we have any samples she can pick up.  Please call pt.

## 2017-05-20 NOTE — Telephone Encounter (Signed)
Called pt. No answer °

## 2017-06-16 ENCOUNTER — Other Ambulatory Visit: Payer: Self-pay | Admitting: Unknown Physician Specialty

## 2017-06-16 DIAGNOSIS — M25512 Pain in left shoulder: Secondary | ICD-10-CM

## 2017-06-19 ENCOUNTER — Ambulatory Visit
Admission: RE | Admit: 2017-06-19 | Discharge: 2017-06-19 | Disposition: A | Discharge status: Home / Self Care | Payer: Medicare Other | Source: Ambulatory Visit | Attending: Unknown Physician Specialty | Admitting: Unknown Physician Specialty

## 2017-06-19 DIAGNOSIS — M7522 Bicipital tendinitis, left shoulder: Secondary | ICD-10-CM | POA: Insufficient documentation

## 2017-06-19 DIAGNOSIS — M25512 Pain in left shoulder: Secondary | ICD-10-CM

## 2017-06-19 DIAGNOSIS — G8929 Other chronic pain: Secondary | ICD-10-CM | POA: Diagnosis present

## 2017-06-19 DIAGNOSIS — M75122 Complete rotator cuff tear or rupture of left shoulder, not specified as traumatic: Secondary | ICD-10-CM | POA: Insufficient documentation

## 2017-07-21 ENCOUNTER — Emergency Department
Admission: EM | Admit: 2017-07-21 | Discharge: 2017-07-21 | Disposition: A | Discharge status: Home / Self Care | Payer: Medicare Other | Attending: Emergency Medicine | Admitting: Emergency Medicine

## 2017-07-21 ENCOUNTER — Encounter: Payer: Self-pay | Admitting: Emergency Medicine

## 2017-07-21 ENCOUNTER — Emergency Department: Payer: Medicare Other

## 2017-07-21 DIAGNOSIS — Z87891 Personal history of nicotine dependence: Secondary | ICD-10-CM | POA: Diagnosis not present

## 2017-07-21 DIAGNOSIS — M25561 Pain in right knee: Secondary | ICD-10-CM | POA: Diagnosis present

## 2017-07-21 DIAGNOSIS — Z79899 Other long term (current) drug therapy: Secondary | ICD-10-CM | POA: Insufficient documentation

## 2017-07-21 DIAGNOSIS — J45909 Unspecified asthma, uncomplicated: Secondary | ICD-10-CM | POA: Insufficient documentation

## 2017-07-21 DIAGNOSIS — I10 Essential (primary) hypertension: Secondary | ICD-10-CM | POA: Insufficient documentation

## 2017-07-21 DIAGNOSIS — M170 Bilateral primary osteoarthritis of knee: Secondary | ICD-10-CM | POA: Insufficient documentation

## 2017-07-21 NOTE — ED Triage Notes (Signed)
Patient presents to ED via POV from home with c/o right medial knee pain x "a couple days". Patient denies injury or trauma. No redness or edema noted to right leg. Mild edema noted to medial knee.

## 2017-07-21 NOTE — Discharge Instructions (Signed)
Modify activities until symptoms improve.   Take tylenol or ibuprofen for pain and inflammation.  Schedule an appointment with Dr. Gavin Potters for follow up.   USE Ice, elevation and rest. You do not have to use ACE wrap compression in the RICE information provided.

## 2017-07-21 NOTE — ED Notes (Addendum)
See triage note   Presents with right knee pain for couple of days  denies any injury but has min swelling  Pain is mainly lateral states she was sent here for an ultrasound of leg

## 2017-07-21 NOTE — ED Provider Notes (Signed)
[facility] Emergency Department Provider Note   ____________________________________________   I have reviewed the triage vital signs and the nursing notes.   HISTORY  Chief Complaint Knee Pain    HPI Patricia Donaldson is a 76 y.o. female presents with right knee medial pain with swelling without traumatic injury. Patient reports approximately 1 week history of the above symptoms. Patient communicated with her cardiologist this morning and they referred her to the emergency department for an ultrasound to determine if symptoms are associated with DVT. Patient denies any changes in level of activity, significant sedentary activity, recent long distance travel, changes in medication, history of cancer, recent surgeries or any other risk factors that would contribute to call the DVT. Patient denies fever, chills, headache, vision changes, chest pain, chest tightness, shortness of breath, abdominal pain, nausea and vomiting.  Past Medical History:  Diagnosis Date  . Allergy   . Anxiety   . 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 Asthma   . Atrial fibrillation (HCC)   . Circulation problem   . Heart disease   . Hypertension   . TIA (transient ischemic attack)    2007  . Tinnitus     Patient Active Problem List   Diagnosis Date Noted  . Thyroid disease 03/10/2017  . Bleeding nose 01/13/2017  . Subacromial bursitis of left shoulder joint 12/19/2016  . Left shoulder pain 12/19/2016  . Chronic a-fib (HCC) 01/02/2016  . 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 Chronic pulmonary hypertension (HCC) 01/03/2015  . Moderate mitral insufficiency 08/10/2014  . Stroke (HCC) 05/25/2014  . Venous stasis of lower extremity 05/20/2011  . TIA on medication 05/20/2011  . Osteopenia 05/20/2011  . Mixed hyperlipidemia 05/20/2011  . Benign essential hypertension 05/20/2011    Past Surgical History:  Procedure Laterality Date  . bladder prolapse  2008   no f/u  with PT   . VAGINAL HYSTERECTOMY     cervix and uterus removed with bladder prolapse surgery     Prior to Admission medications   Medication Sig Start Date End Date Taking? Authorizing Provider  Cholecalciferol (VITAMIN D3) 400 UNITS tablet Take 400 Units by mouth 3 (three) times daily.    [provider]  diltiazem (DILACOR XR) 120 MG 24 hr capsule Take 120 mg by mouth daily.    [provider]  losartan-hydrochlorothiazide Mauri Reading) 50-12.5 MG tablet  01/10/17   [provider]  mirabegron ER (MYRBETRIQ) 50 MG TB24 tablet Take 1 tablet (50 mg total) by mouth daily. 05/09/17   Vanna Scotland, MD  rivaroxaban (XARELTO) 20 MG TABS tablet Take 20 mg by mouth at bedtime.    [provider]  rosuvastatin (CRESTOR) 5 MG tablet  02/13/17   [provider]    Allergies Erythromycin; Aspirin; Other; Penicillins; and Lobster [shellfish allergy]  Family History  Problem Relation Age of Onset  . Cancer Mother        stomach  . Breast cancer Maternal Grandmother   . Kidney disease Neg Hx   . Bladder Cancer Neg Hx     Social History Social History  Substance Use Topics  . Smoking status: Former Smoker    Packs/day: 0.00    Quit date: 08/31/1959  . Smokeless tobacco: Never Used  . Alcohol use No    Review of Systems Constitutional: Negative for fever/chills Eyes: No visual changes. ENT:  Negative for sore throat and for difficulty swallowing Cardiovascular: Denies chest pain. Respiratory:  Denies cough. Denies shortness of breath. Gastrointestinal: No abdominal pain.  No nausea, vomiting, diarrhea. Genitourinary: Negative for dysuria. Musculoskeletal: Positive for right medial knee pain and swelling.. Skin: Negative for rash. Neurological: Negative for headaches. Able to ambulate. ____________________________________________   PHYSICAL EXAM:  VITAL SIGNS: ED Triage Vitals [07/21/17 1244]  Enc Vitals Group     BP (!) 128/96     Pulse  Rate 86     Resp 15     Temp      Temp src      SpO2 96 %     Weight 174 lb (78.9 kg)     Height  (1.6 m)     Head Circumference      Peak Flow      Pain Score      Pain Loc      Pain Edu?      Excl. in GC?     Constitutional: Alert and oriented. Well appearing and in no acute distress.  Eyes: Conjunctivae are normal. PERRL. EOMI  Head: Normocephalic and atraumatic.  Neck:Supple. No thyromegaly. No stridor.  Cardiovascular: Normal rate, regular rhythm. Normal S1 and S2.  Good peripheral circulation. Respiratory: Normal respiratory effort without tachypnea or retractions. Lungs CTAB. No wheezes/rales/rhonchi. Good air entry to the bases with no decreased or absent breath sounds. Hematological/Lymphatic/Immunological: No cervical lymphadenopathy. Cardiovascular: Normal rate, regular rhythm. Normal distal pulses. Gastrointestinal: Bowel sounds 4 quadrants. Soft and nontender to palpation. No guarding or rigidity. No palpable masses. No distention. No CVA tenderness. Musculoskeletal: Right medial knee pain with swelling. Intact right knee joint range of motion without limitation or deformity. No erythema noted. Intact sensation and strength of the right lower extremity. Neurologic: Normal speech and language.  Skin:  Skin is warm, dry and intact. No rash noted. Psychiatric: Mood and affect are normal. Speech and behavior are normal. Patient exhibits appropriate insight and judgement.  ____________________________________________   LABS (all labs ordered are listed, but only abnormal results are displayed)  Labs Reviewed - No data to display ____________________________________________  EKG none ____________________________________________  RADIOLOGY DG right knee complete  IMPRESSION: Moderate osteoarthritic joint space loss of the medial compartment. Milder osteoarthritic spurring of the tibial spines and patellofemoral joint. There is no acute bony abnormality.  There is considerable soft tissue swelling over the medial aspect of the Knee.  US Venous Img lower unilateral right IMPRESSION: No evidence of DVT within the right lower extremity. ____________________________________________   PROCEDURES  Procedure(s) performed: no    Critical Care performed: no ____________________________________________   INITIAL IMPRESSION / ASSESSMENT AND PLAN / ED COURSE  Pertinent labs & imaging results that were available during my care of the patient were reviewed by me and considered in my medical decision making (see chart for details).   Patient presents to emergency department with right knee pain and swelling without traumatic injury. History, physical exam findings, and imaging results are reassuring symptoms are consistent with right knee osteoarthritis exacerbation. Review of radiograph results revealed osteoarthritis in the medial compartment of the right knee consistent with patient's symptoms. Advised patient to modify weightbearing activities, she may take Tylenol or NSAIDs as needed for pain and inflammation. Patient advised to follow up with her orthopedic provider as soon as she can schedule an appointment or return to the emergency department if symptoms return or worsen. Patient informed of clinical course, understand medical decision-making process, and agree with plan.     ____________________________________________   FINAL CLINICAL IMPRESSION(S) / ED DIAGNOSES  Final diagnoses:  Acute pain of right knee  Primary osteoarthritis of both knees       NEW MEDICATIONS STARTED DURING THIS VISIT:  Discharge Medication List as of 07/21/2017  3:33 PM       Note:  This document was prepared using Dragon voice recognition software and may include unintentional dictation errors.   Clois Comber, PA-C 07/21/17 1714    Minna Antis, MD 07/22/17 2015

## 2018-01-16 ENCOUNTER — Ambulatory Visit (INDEPENDENT_AMBULATORY_CARE_PROVIDER_SITE_OTHER): Payer: Medicare Other | Admitting: Urology

## 2018-01-16 ENCOUNTER — Other Ambulatory Visit: Payer: Self-pay

## 2018-01-16 ENCOUNTER — Other Ambulatory Visit
Admission: RE | Admit: 2018-01-16 | Discharge: 2018-01-16 | Disposition: A | Discharge status: Home / Self Care | Payer: Medicare Other | Source: Ambulatory Visit | Attending: Urology | Admitting: Urology

## 2018-01-16 ENCOUNTER — Encounter: Payer: Self-pay | Admitting: Urology

## 2018-01-16 VITALS — BP 131/85 | HR 89 | Ht 63.0 in | Wt 173.0 lb

## 2018-01-16 DIAGNOSIS — R6889 Other general symptoms and signs: Secondary | ICD-10-CM

## 2018-01-16 DIAGNOSIS — R350 Frequency of micturition: Secondary | ICD-10-CM

## 2018-01-16 LAB — URINALYSIS, DIPSTICK ONLY
BILIRUBIN URINE: NEGATIVE
Glucose, UA: NEGATIVE mg/dL
Ketones, ur: NEGATIVE mg/dL
Leukocytes, UA: NEGATIVE
Nitrite: NEGATIVE
Protein, ur: NEGATIVE mg/dL
SPECIFIC GRAVITY, URINE: 1.01 (ref 1.005–1.030)
pH: 5.5 (ref 5.0–8.0)

## 2018-01-16 LAB — BLADDER SCAN AMB NON-IMAGING: Scan Result: 0

## 2018-01-16 MED ORDER — MIRABEGRON ER 25 MG PO TB24: 25.0000 mg | ORAL_TABLET | Freq: Every day | ORAL | 11 refills | Status: DC

## 2018-01-16 NOTE — Progress Notes (Addendum)
01/16/2018 2:35 PM   Patricia Donaldson July 18, 1941 [id number]  Referring provider: Cheryll Dessert, FNP [phone] [address] [name], [place] [address]  Chief Complaint  Patient presents with  . Urinary Frequency    HPI: 26 female known to me for history of OAB, urge incontinence.  She returns to the office today primarily complaining of nocturia getting up 3-5 times daily to void.  She states that this is extremely disruptive and makes her tired in the morning.  She previously brought in a voiding diary which indicated she only got up 1-3 times each night voiding small volumes 100-300 cc at time.  At that time, she is also getting up every night to have bowel movements.  She wears a diaper for safety when she goes on long distnance trips.    She does engage in toilet mapping.  She does have a very small cystocele.  She previously failed Toviaz.  She was given 4 weeks of Myrbetriq 25 mg and initially did not even try this medication because she was concerned about blood pressure issues.  She eventually took the 25 mg and had a very good success at her follow-up visit.  She was prescribed 50 mg.  Apparently today, she does not even recall having a second visit and believes she may have stopped this medication sometime last summer due to concern for dizziness attributing it to the Myrbetriq.  She did not call and let us know about this.  She cannot remember the exact circumstances.  PMH: Past Medical History:  Diagnosis Date  . Allergy   . Anxiety   . 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 Asthma   . Atrial fibrillation (HCC)   . Circulation problem   . Heart disease   . Hypertension   . TIA (transient ischemic attack)    2007  . Tinnitus     Surgical History: Past Surgical History:  Procedure Laterality Date  . bladder prolapse  2008   no f/u with PT   . VAGINAL HYSTERECTOMY     cervix and uterus removed with bladder prolapse surgery     Home  Medications:  Allergies as of 01/16/2018      Reactions   Erythromycin Hives, Rash   Aspirin    Other Other (See Comments)   Medication which has discolored lower extremity/feet.   Penicillins    Lobster [shellfish Allergy] Hives, Rash      Medication List        Accurate as of 01/16/18 11:59 PM. Always use your most recent med list.          citalopram 10 MG tablet Commonly known as:  CELEXA Take 10 mg by mouth daily.   diltiazem 120 MG 24 hr capsule Commonly known as:  DILACOR XR Take 120 mg by mouth daily.   losartan-hydrochlorothiazide 50-12.5 MG tablet Commonly known as:  HYZAAR   mirabegron ER 25 MG Tb24 tablet Commonly known as:  MYRBETRIQ Take 1 tablet (25 mg total) by mouth daily.   rivaroxaban 20 MG Tabs tablet Commonly known as:  XARELTO Take 20 mg by mouth at bedtime.   rosuvastatin 5 MG tablet Commonly known as:  CRESTOR   Vitamin D3 400 units tablet Take 400 Units by mouth 3 (three) times daily.       Allergies:  Allergies  Allergen Reactions  . Erythromycin Hives and Rash  . Aspirin   . Other Other (See Comments)  Medication which has discolored lower extremity/feet.  Marland Kitchen. Penicillins   . Lobster [Shellfish Allergy] Hives and Rash    Family History: Family History  Problem Relation Age of Onset  . Cancer Mother        stomach  . Breast cancer Maternal Grandmother   . Kidney disease Neg Hx   . Bladder Cancer Neg Hx     Social History:  reports that she quit smoking about 58 years ago. She smoked 0.00 packs per day. She has never used smokeless tobacco. She reports that she does not drink alcohol. Her drug history is not on file.  ROS: UROLOGY Frequent Urination?: Yes Hard to postpone urination?: Yes Burning/pain with urination?: No Get up at night to urinate?: Yes Leakage of urine?: No Urine stream starts and stops?: No Trouble starting stream?: No Do you have to strain to urinate?: No Blood in urine?: No Urinary tract  infection?: No Sexually transmitted disease?: No Injury to kidneys or bladder?: No Painful intercourse?: No Weak stream?: No Currently pregnant?: No Vaginal bleeding?: No Last menstrual period?: n  Gastrointestinal Nausea?: No Vomiting?: No Indigestion/heartburn?: No Diarrhea?: No Constipation?: No  Constitutional Fever: No Night sweats?: No Weight loss?: Yes Fatigue?: Yes  Skin Skin rash/lesions?: No Itching?: Yes  Eyes Blurred vision?: Yes Double vision?: No  Ears/Nose/Throat Sore throat?: No Sinus problems?: No  Hematologic/Lymphatic Swollen glands?: No Easy bruising?: Yes  Cardiovascular Leg swelling?: No Chest pain?: No  Respiratory Cough?: No Shortness of breath?: Yes  Endocrine Excessive thirst?: No  Musculoskeletal Back pain?: No Joint pain?: Yes  Neurological Headaches?: No Dizziness?: No  Psychologic Depression?: Yes Anxiety?: Yes  Physical Exam: BP 131/85   Pulse 89   Ht 5\' 3"  (1.6 m)   Wt 173 lb (78.5 kg)   BMI 30.65 kg/m   Constitutional:  Alert and oriented, No acute distress. HEENT: [place] AT, moist mucus membranes.  Trachea midline, no masses. Cardiovascular: No clubbing, cyanosis, or edema. Respiratory: Normal respiratory effort, no increased work of breathing.. Skin: No rashes, bruises or suspicious lesions. Neurologic: Grossly intact, no focal deficits, moving all 4 extremities. Psychiatric: Normal mood and affect.  Laboratory Data: Lab Results  Component Value Date   WBC 9.2 01/06/2017   HGB 15.0 01/06/2017   HCT 44.5 01/06/2017   MCV 85.8 01/06/2017   PLT 271 01/06/2017    Lab Results  Component Value Date   CREATININE 0.64 11/22/2015    Urinalysis Results for orders placed or performed during the hospital encounter of 01/16/18  Urinalysis, dipstick only (For [facility] ONLY)  Result Value Ref Range   Color, Urine YELLOW YELLOW   APPearance CLEAR CLEAR   Specific Gravity, Urine 1.010 1.005 - 1.030   pH  5.5 5.0 - 8.0   Glucose, UA NEGATIVE NEGATIVE mg/dL   Hgb urine dipstick TRACE (A) NEGATIVE   Bilirubin Urine NEGATIVE NEGATIVE   Ketones, ur NEGATIVE NEGATIVE mg/dL   Protein, ur NEGATIVE NEGATIVE mg/dL   Nitrite NEGATIVE NEGATIVE   Leukocytes, UA NEGATIVE NEGATIVE    Pertinent Imaging: N/a  Assessment & Plan:    1. Urinary frequency/ nocturia We reviewed behavioral modification strategies again today Strongly encouraged her to go back on Myrbetriq 25 mg as she previously reports good results with this Dizziness is unusual side effect of medication and she took this for months without issues, I am highly suspicious that Myrbetriq was not the cause of her symptoms She was given another 4 weeks of samples Prescribed to her pharmacy, she  will call if this is not effective  2. Forgetfulness I am somewhat concerned about her forgetfulness today, as such I prefer to avoid anticholinergics She apparently does not remember her last visit whatsoever and cannot recall the details of why she stopped the medication in the past I will send this note to her PCP to let her know my concerns  - BLADDER SCAN AMB NON-IMAGING  F/u prn  Vanna Scotland, MD  [facility] [address] [place], [place] [address] [phone]

## 2018-01-17 ENCOUNTER — Encounter: Payer: Self-pay | Admitting: Urology

## 2018-01-19 ENCOUNTER — Other Ambulatory Visit: Payer: Self-pay

## 2018-01-19 MED ORDER — MIRABEGRON ER 25 MG PO TB24: 25.0000 mg | ORAL_TABLET | Freq: Every day | ORAL | 3 refills | Status: DC

## 2018-04-06 ENCOUNTER — Telehealth: Payer: Self-pay | Admitting: Urology

## 2018-04-06 NOTE — Telephone Encounter (Signed)
Increase dose to 50 mg  Vanna ScotlandAshley Ashanti Ratti, MD

## 2018-04-06 NOTE — Telephone Encounter (Signed)
Patient is calling and asking if she can take AZO for her frequent urination? She said the mirabegron ER (MYRBETRIQ) 25 MG TB24 tablet is not working? If not what else can she try? She would like a call back to discuss.   Patricia Donaldson

## 2018-04-06 NOTE — Telephone Encounter (Signed)
Spoke with patient and she states she is not having any UTI symptoms and the urinary frequency has been getting worse progressively and she does not think the Myrbetriq is working. Can the dose be increased or does she need an apt to discuss other medication management? Please advise thanks

## 2018-04-07 ENCOUNTER — Telehealth: Payer: Self-pay | Admitting: Urology

## 2018-04-07 NOTE — Telephone Encounter (Signed)
[facility] Morning?   Patricia DusterMichelle

## 2018-04-07 NOTE — Telephone Encounter (Signed)
Patient notified, put 1 month of 50mg  at front desk for patient to pick up

## 2018-06-11 ENCOUNTER — Telehealth: Payer: Self-pay | Admitting: Urology

## 2018-06-11 NOTE — Telephone Encounter (Signed)
Pt called office stating that the 50mg  Mybetriq is not working for her, states she continues to wake frequently at night as well as daytime frequency, pt concerned about not being able to get sleep at night due to the frequency.  Pt also asks if there are any physical therapy that may help as well. Pt feels like she is becoming depressed about this issue.  Pt would like to have someone to call her with recommendations and asks for a referral to physical therapy. Please advise pt at [phone].

## 2018-06-11 NOTE — Telephone Encounter (Signed)
Pt made appointment with Dr. Apolinar JunesBrandon

## 2018-06-12 ENCOUNTER — Ambulatory Visit: Payer: Self-pay | Admitting: Urology

## 2018-06-12 ENCOUNTER — Encounter: Payer: Self-pay | Admitting: Urology

## 2018-06-12 ENCOUNTER — Telehealth: Payer: Self-pay | Admitting: Urology

## 2018-06-12 ENCOUNTER — Ambulatory Visit (INDEPENDENT_AMBULATORY_CARE_PROVIDER_SITE_OTHER): Payer: Medicare Other | Admitting: Urology

## 2018-06-12 VITALS — BP 116/69 | HR 81 | Ht 63.0 in | Wt 176.0 lb

## 2018-06-12 DIAGNOSIS — R350 Frequency of micturition: Secondary | ICD-10-CM

## 2018-06-12 NOTE — Telephone Encounter (Signed)
Pt needs to be set up for PTNS in [place] per Dr Apolinar JunesBrandon

## 2018-06-12 NOTE — Progress Notes (Signed)
06/12/2018 4:10 PM   Patricia Donaldson 23-Oct-1941 [id number]  Referring provider: Cheryll DessertGeyer, Katherine, FNP No address on file  Chief Complaint  Patient presents with  . Nocturia    HPI: 77 year old female with severe OAB symptoms and urge incontinence he returns today for follow-up.  Since her last visit, she is tried Myrbetriq 25 mg and up the dose to 50 mg.  She does little to no change in his urinary symptoms.  As per previous, she is primarily concerned about nocturia and the disruption and extend her sleep.  She also notes that she is becoming depressed as a result of not sleeping through the night.  Previous voiding diaries do indicate that she voids frequent small volumes both during the day and at nighttime.  She is previously failed Toviaz as well.  She continues to have issues with memory and attention.  PMH: Past Medical History:  Diagnosis Date  . Allergy   . Anxiety   . 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 Asthma   . Atrial fibrillation (HCC)   . Circulation problem   . Heart disease   . Hypertension   . TIA (transient ischemic attack)    2007  . Tinnitus     Surgical History: Past Surgical History:  Procedure Laterality Date  . bladder prolapse  2008   no f/u with PT   . VAGINAL HYSTERECTOMY     cervix and uterus removed with bladder prolapse surgery     Home Medications:  Allergies as of 06/12/2018      Reactions   Erythromycin Hives, Rash   Aspirin    Other Other (See Comments)   Medication which has discolored lower extremity/feet.   Penicillins    Lobster [shellfish Allergy] Hives, Rash      Medication List        Accurate as of 06/12/18  4:10 PM. Always use your most recent med list.          citalopram 10 MG tablet Commonly known as:  CELEXA Take 10 mg by mouth daily.   diltiazem 120 MG 24 hr capsule Commonly known as:  DILACOR XR Take 120 mg by mouth daily.   losartan-hydrochlorothiazide 50-12.5 MG tablet Commonly known  as:  HYZAAR   mirabegron ER 25 MG Tb24 tablet Commonly known as:  MYRBETRIQ Take 1 tablet (25 mg total) by mouth daily.   rivaroxaban 20 MG Tabs tablet Commonly known as:  XARELTO Take 20 mg by mouth at bedtime.   rosuvastatin 5 MG tablet Commonly known as:  CRESTOR   Vitamin D3 400 units tablet Take 400 Units by mouth 3 (three) times daily.       Allergies:  Allergies  Allergen Reactions  . Erythromycin Hives and Rash  . Aspirin   . Other Other (See Comments)    Medication which has discolored lower extremity/feet.  Marland Kitchen. Penicillins   . Lobster [Shellfish Allergy] Hives and Rash    Family History: Family History  Problem Relation Age of Onset  . Cancer Mother        stomach  . Breast cancer Maternal Grandmother   . Kidney disease Neg Hx   . Bladder Cancer Neg Hx     Social History:  reports that she quit smoking about 58 years ago. She smoked 0.00 packs per day. She has never used smokeless tobacco. She reports that she does not drink alcohol. Her drug history is not on file.  ROS: UROLOGY  Frequent Urination?: Yes Hard to postpone urination?: Yes Burning/pain with urination?: No Get up at night to urinate?: Yes Leakage of urine?: No Urine stream starts and stops?: Yes Trouble starting stream?: No Do you have to strain to urinate?: No Blood in urine?: No Urinary tract infection?: No Sexually transmitted disease?: No Injury to kidneys or bladder?: No Painful intercourse?: No Weak stream?: No Currently pregnant?: No Vaginal bleeding?: No Last menstrual period?: n  Gastrointestinal Nausea?: Yes Vomiting?: No Indigestion/heartburn?: No Diarrhea?: No Constipation?: No  Constitutional Fever: No Night sweats?: No Weight loss?: No Fatigue?: No  Skin Skin rash/lesions?: No Itching?: No  Eyes Blurred vision?: No Double vision?: No  Ears/Nose/Throat Sore throat?: No Sinus problems?: No  Hematologic/Lymphatic Swollen glands?: No Easy  bruising?: Yes  Cardiovascular Leg swelling?: Yes Chest pain?: No  Respiratory Cough?: No Shortness of breath?: Yes  Endocrine Excessive thirst?: No  Musculoskeletal Back pain?: No Joint pain?: No  Neurological Headaches?: No Dizziness?: Yes  Psychologic Depression?: Yes Anxiety?: Yes  Physical Exam: BP 116/69   Pulse 81   Ht 5\' 3"  (1.6 m)   Wt 176 lb (79.8 kg)   BMI 31.18 kg/m   Constitutional:  Alert and oriented, No acute distress. HEENT: [place] AT, moist mucus membranes.  Trachea midline, no masses. Cardiovascular: No clubbing, cyanosis, or edema. Respiratory: Normal respiratory effort, no increased work of breathing. Skin: No rashes, bruises or suspicious lesions. Neurologic: Grossly intact, no focal deficits, moving all 4 extremities. Psychiatric: Normal mood and affect.  Laboratory Data: Lab Results  Component Value Date   WBC 9.2 01/06/2017   HGB 15.0 01/06/2017   HCT 44.5 01/06/2017   MCV 85.8 01/06/2017   PLT 271 01/06/2017    Lab Results  Component Value Date   CREATININE 0.64 11/22/2015    Urinalysis N/a  Pertinent Imaging: n/a  Assessment & Plan:    1. Urinary frequency Refractory OAB Previously failed anticholinergics including Toviaz which I hesitate to prescribe further due to her mental status and memory issues She is also failed beta 3 agonist at max dose We do lengthy discussion today about treatment options for refractory OAB symptoms Botox and posterior nerve stim elation were discussed primarily She is interested in PTNS-we discussed its efficacy as well as the number of treatment options needed and time commitment She is given handouts on each of the options  Schedule PTNS  Vanna ScotlandAshley Kirstie Larsen, MD  [facility] [address], [address] [place], [place] [address] [phone]

## 2018-06-25 NOTE — Telephone Encounter (Signed)
Prior authorization is not required for PTNS therapy (CPT [id number]) for 12 visits.    06/17/18 - left a message for the patient to call the office to schedule PTNS.

## 2018-07-01 ENCOUNTER — Other Ambulatory Visit (HOSPITAL_COMMUNITY): Payer: Self-pay | Admitting: Acute Care

## 2018-07-01 ENCOUNTER — Other Ambulatory Visit: Payer: Self-pay | Admitting: Acute Care

## 2018-07-01 DIAGNOSIS — R410 Disorientation, unspecified: Secondary | ICD-10-CM

## 2018-07-09 ENCOUNTER — Telehealth: Payer: Self-pay | Admitting: Urology

## 2018-07-09 NOTE — Telephone Encounter (Signed)
-----   Message from Vanna ScotlandAshley Brandon, MD sent at 06/12/2018  4:13 PM EDT ----- Please verify insurance and prior authorization as needed for posterior nerve stimulation and schedule the patient for 12 treatments.  She can start seeing Carollee HerterShannon in follow-up after she completes this.

## 2018-07-09 NOTE — Telephone Encounter (Signed)
-----   Message from Ashley Brandon, MD sent at 06/12/2018  4:13 PM EDT ----- °Please verify insurance and prior authorization as needed for posterior nerve stimulation and schedule the patient for 12 treatments.  She can start seeing Shannon in follow-up after she completes this.  °

## 2018-07-09 NOTE — Telephone Encounter (Signed)
No PA required  Patient was called on 06/17/18  No response

## 2018-07-21 ENCOUNTER — Encounter (HOSPITAL_COMMUNITY): Payer: Self-pay

## 2018-07-21 ENCOUNTER — Ambulatory Visit (HOSPITAL_COMMUNITY): Payer: Medicare Other

## 2018-07-21 ENCOUNTER — Ambulatory Visit (HOSPITAL_COMMUNITY): Admit: 2018-07-21 | Payer: Medicare Other

## 2018-07-21 SURGERY — MRI WITH ANESTHESIA
Anesthesia: General

## 2018-07-30 ENCOUNTER — Emergency Department: Payer: Medicare Other

## 2018-07-30 ENCOUNTER — Telehealth: Payer: Self-pay | Admitting: Urology

## 2018-07-30 ENCOUNTER — Encounter: Payer: Self-pay | Admitting: Emergency Medicine

## 2018-07-30 ENCOUNTER — Emergency Department
Admission: EM | Admit: 2018-07-30 | Discharge: 2018-07-30 | Disposition: A | Discharge status: Home / Self Care | Payer: Medicare Other | Attending: Emergency Medicine | Admitting: Emergency Medicine

## 2018-07-30 DIAGNOSIS — I10 Essential (primary) hypertension: Secondary | ICD-10-CM | POA: Insufficient documentation

## 2018-07-30 DIAGNOSIS — Z8673 Personal history of transient ischemic attack (TIA), and cerebral infarction without residual deficits: Secondary | ICD-10-CM | POA: Diagnosis not present

## 2018-07-30 DIAGNOSIS — Z87891 Personal history of nicotine dependence: Secondary | ICD-10-CM | POA: Diagnosis not present

## 2018-07-30 DIAGNOSIS — Z79899 Other long term (current) drug therapy: Secondary | ICD-10-CM | POA: Diagnosis not present

## 2018-07-30 DIAGNOSIS — J45909 Unspecified asthma, uncomplicated: Secondary | ICD-10-CM | POA: Diagnosis not present

## 2018-07-30 DIAGNOSIS — R55 Syncope and collapse: Secondary | ICD-10-CM | POA: Insufficient documentation

## 2018-07-30 LAB — CBC
HCT: 43.9 % (ref 35.0–47.0)
HEMOGLOBIN: 15 g/dL (ref 12.0–16.0)
MCH: 30.4 pg (ref 26.0–34.0)
MCHC: 34.2 g/dL (ref 32.0–36.0)
MCV: 88.8 fL (ref 80.0–100.0)
PLATELETS: 258 10*3/uL (ref 150–440)
RBC: 4.94 MIL/uL (ref 3.80–5.20)
RDW: 14.5 % (ref 11.5–14.5)
WBC: 6.5 10*3/uL (ref 3.6–11.0)

## 2018-07-30 LAB — URINALYSIS, COMPLETE (UACMP) WITH MICROSCOPIC
BACTERIA UA: NONE SEEN
Bilirubin Urine: NEGATIVE
Glucose, UA: NEGATIVE mg/dL
Hgb urine dipstick: NEGATIVE
Ketones, ur: NEGATIVE mg/dL
Leukocytes, UA: NEGATIVE
Nitrite: NEGATIVE
PH: 7 (ref 5.0–8.0)
Protein, ur: NEGATIVE mg/dL
SPECIFIC GRAVITY, URINE: 1.011 (ref 1.005–1.030)

## 2018-07-30 LAB — COMPREHENSIVE METABOLIC PANEL
ALBUMIN: 4.1 g/dL (ref 3.5–5.0)
ALK PHOS: 57 U/L (ref 38–126)
ALT: 20 U/L (ref 0–44)
ANION GAP: 12 (ref 5–15)
AST: 34 U/L (ref 15–41)
BILIRUBIN TOTAL: 1 mg/dL (ref 0.3–1.2)
BUN: 25 mg/dL — AB (ref 8–23)
CALCIUM: 9.3 mg/dL (ref 8.9–10.3)
CO2: 22 mmol/L (ref 22–32)
Chloride: 108 mmol/L (ref 98–111)
Creatinine, Ser: 0.67 mg/dL (ref 0.44–1.00)
GFR calc Af Amer: >60 mL/min (ref >60)
GFR calc non Af Amer: >60 mL/min (ref >60)
GLUCOSE: 106 mg/dL — AB (ref 70–99)
Potassium: 4.3 mmol/L (ref 3.5–5.1)
SODIUM: 142 mmol/L (ref 135–145)
TOTAL PROTEIN: 7.5 g/dL (ref 6.5–8.1)

## 2018-07-30 LAB — TROPONIN I: Troponin I: <0.03 ng/mL (ref <0.03)

## 2018-07-30 NOTE — Telephone Encounter (Signed)
This patient is scheduled to see me Friday (tomorrow) in [place].  She was just seen recently (9/12) and we discussed PTNS vs Botox for this same issue.  It looks like she elected PTNS but never did this.    Prior to her coming in, can you f/u with her and make sure this she really needs to be seen?  Tomorrows f/u looks redundant.  Vanna Scotland, MD

## 2018-07-30 NOTE — ED Triage Notes (Signed)
PT arrived with sudden onset of dizziness at 0830. Pt states "I felt like I was going to black out." Pt denies any pain and arrives a&o x4.

## 2018-07-30 NOTE — ED Notes (Signed)
Pt assisted to the bathroom.

## 2018-07-30 NOTE — ED Provider Notes (Signed)
[facility] Emergency Department Provider Note   ____________________________________________    I have reviewed the triage vital signs and the nursing notes.   HISTORY  Chief Complaint Dizziness     HPI Patricia Donaldson is a 77 y.o. female who presents with complaints of dizziness.  Patient reports that she just eaten breakfast, was walking on her kitchen and started to feel lightheaded and concerned that she might faint.  She denies chest pain or palpitations.  No shortness of breath.  She sat briefly on her couch but then became concerned that EMS would be able to get insisted up again to open the door.  She did not lie down.  She reports this is not common for her.  Currently she feels significantly better but reports feeling fatigued.  No nausea or vomiting.  No new medications.  No headache or neuro deficits   Past Medical History:  Diagnosis Date  . Allergy   . Anxiety   . 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 Asthma   . Atrial fibrillation (HCC)   . Circulation problem   . Heart disease   . Hypertension   . TIA (transient ischemic attack)    2007  . Tinnitus     Patient Active Problem List   Diagnosis Date Noted  . Thyroid disease 03/10/2017  . Bleeding nose 01/13/2017  . Subacromial bursitis of left shoulder joint 12/19/2016  . Left shoulder pain 12/19/2016  . Chronic a-fib 01/02/2016  . 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 Chronic pulmonary hypertension (HCC) 01/03/2015  . Moderate mitral insufficiency 08/10/2014  . Stroke (HCC) 05/25/2014  . Venous stasis of lower extremity 05/20/2011  . TIA on medication 05/20/2011  . Osteopenia 05/20/2011  . Mixed hyperlipidemia 05/20/2011  . Benign essential hypertension 05/20/2011    Past Surgical History:  Procedure Laterality Date  . bladder prolapse  2008   no f/u with PT   . VAGINAL HYSTERECTOMY     cervix and uterus removed with bladder prolapse surgery     Prior  to Admission medications   Medication Sig Start Date End Date Taking? Authorizing Provider  losartan (COZAAR) 50 MG tablet Take 50 mg by mouth daily.   Yes [provider]  rivaroxaban (XARELTO) 20 MG TABS tablet Take 20 mg by mouth daily with supper.    Yes [provider]  traZODone (DESYREL) 50 MG tablet Take 50 mg by mouth at bedtime.   Yes [provider]  mirabegron ER (MYRBETRIQ) 25 MG TB24 tablet Take 1 tablet (25 mg total) by mouth daily. Patient not taking: Reported on 07/30/2018 01/19/18   Riki Altes, MD     Allergies Erythromycin; Aspirin; Other; Penicillins; and Lobster [shellfish allergy]  Family History  Problem Relation Age of Onset  . Cancer Mother        stomach  . Breast cancer Maternal Grandmother   . Kidney disease Neg Hx   . Bladder Cancer Neg Hx     Social History Social History   Tobacco Use  . Smoking status: Former Smoker    Packs/day: 0.00    Last attempt to quit: 08/31/1959    Years since quitting: 58.9  . Smokeless tobacco: Never Used  Substance Use Topics  . Alcohol use: No  . Drug use: Not on file    Review of Systems  Constitutional: No fever/chills Eyes: No visual changes.  ENT: No neck pain Cardiovascular: Denies chest pain.  Respiratory: Denies shortness of breath. Gastrointestinal: No abdominal pain.  Genitourinary: Negative for dysuria. Musculoskeletal: Negative for back pain. Skin: Negative for rash. Neurological: Negative for headaches    ____________________________________________   PHYSICAL EXAM:  VITAL SIGNS: ED Triage Vitals  Enc Vitals Group     BP 07/30/18 0952 (!) 156/101     Pulse Rate 07/30/18 0952 (!) 58     Resp 07/30/18 0952 18     Temp 07/30/18 0952 98.4 F (36.9 C)     Temp Source 07/30/18 0952 Oral     SpO2 07/30/18 0952 98 %     Weight 07/30/18 0946 80.3 kg (177 lb)     Height 07/30/18 0946 1.6 m (5\' 3" )     Head Circumference --      Peak Flow --      Pain Score  07/30/18 0945 0     Pain Loc --      Pain Edu? --      Excl. in GC? --     Constitutional: Alert and oriented.  Eyes: Conjunctivae are normal.  Head: Atraumatic. Nose: No congestion/rhinnorhea. Mouth/Throat: Mucous membranes are moist.    Cardiovascular: Normal rate, regular rhythm. Grossly normal heart sounds.  Good peripheral circulation. Respiratory: Normal respiratory effort.  No retractions. Lungs CTAB. Gastrointestinal: Soft and nontender. No distention..  Musculoskeletal: Warm and well perfused Neurologic:  Normal speech and language. No gross focal neurologic deficits are appreciated.  Skin:  Skin is warm, dry and intact. No rash noted. Psychiatric: Mood and affect are normal. Speech and behavior are normal.  ____________________________________________   LABS (all labs ordered are listed, but only abnormal results are displayed)  Labs Reviewed  URINALYSIS, COMPLETE (UACMP) WITH MICROSCOPIC - Abnormal; Notable for the following components:      Result Value   Color, Urine STRAW (*)    APPearance CLEAR (*)    All other components within normal limits  COMPREHENSIVE METABOLIC PANEL - Abnormal; Notable for the following components:   Glucose, Bld 106 (*)    BUN 25 (*)    All other components within normal limits  TROPONIN I  CBC  CBG MONITORING, ED   ____________________________________________  EKG  ED ECG REPORT I, Jene Every, the attending physician, personally viewed and interpreted this ECG.  Date: 07/30/2018  Rhythm: Atrial fibrillation QRS Axis: normal Intervals: Abnormal ST/T Wave abnormalities: normal   ____________________________________________  RADIOLOGY  Chest x-ray unremarkable ____________________________________________   PROCEDURES  Procedure(s) performed: No  Procedures   Critical Care performed: No ____________________________________________   INITIAL IMPRESSION / ASSESSMENT AND PLAN / ED COURSE  Pertinent labs &  imaging results that were available during my care of the patient were reviewed by me and considered in my medical decision making (see chart for details).  Patient presents after a near syncopal episode, she seems to be greatly improved now.  Vital signs unremarkable.  Will check labs, give IV fluids check chest x-ray placed in a cardiac monitor and reevaluate  Patient's work-up is overall quite reassuring.  Possibly some mild dehydration.  She has received fluids in the ED.  Orthostatics normal.  She feels quite well.  Appropriate for discharge at this time with return precautions.  Follow-up with PCP.    ____________________________________________   FINAL CLINICAL IMPRESSION(S) / ED DIAGNOSES  Final diagnoses:  Near syncope        Note:  This document was prepared using Dragon voice recognition software and may include unintentional dictation errors.    Rozena Fierro,  Molly Maduro, MD 07/30/18 1236

## 2018-07-30 NOTE — ED Notes (Signed)
Patient transported to CT 

## 2018-07-31 ENCOUNTER — Telehealth: Payer: Self-pay | Admitting: Urology

## 2018-07-31 ENCOUNTER — Ambulatory Visit: Payer: Medicare Other | Admitting: Urology

## 2018-07-31 ENCOUNTER — Encounter

## 2018-07-31 NOTE — Telephone Encounter (Signed)
Patient has been scheduled for her PTNS appointments, per [name] she has agreed to do them in [place].  Patricia Donaldson

## 2018-07-31 NOTE — Telephone Encounter (Signed)
Spoke with patient and she would like to set up PTNS in [place]. Apt for today was cancelled and will set up PTNS in [facility]

## 2018-08-12 ENCOUNTER — Ambulatory Visit
Admission: EM | Admit: 2018-08-12 | Discharge: 2018-08-12 | Disposition: A | Discharge status: Home / Self Care | Payer: Medicare Other | Attending: Family Medicine | Admitting: Family Medicine

## 2018-08-12 ENCOUNTER — Encounter: Payer: Self-pay | Admitting: Emergency Medicine

## 2018-08-12 ENCOUNTER — Other Ambulatory Visit: Payer: Self-pay

## 2018-08-12 DIAGNOSIS — Z87891 Personal history of nicotine dependence: Secondary | ICD-10-CM | POA: Insufficient documentation

## 2018-08-12 DIAGNOSIS — R350 Frequency of micturition: Secondary | ICD-10-CM | POA: Insufficient documentation

## 2018-08-12 DIAGNOSIS — Z881 Allergy status to other antibiotic agents status: Secondary | ICD-10-CM | POA: Diagnosis not present

## 2018-08-12 DIAGNOSIS — I10 Essential (primary) hypertension: Secondary | ICD-10-CM | POA: Diagnosis not present

## 2018-08-12 DIAGNOSIS — Z880 Allergy status to penicillin: Secondary | ICD-10-CM | POA: Diagnosis not present

## 2018-08-12 DIAGNOSIS — Z886 Allergy status to analgesic agent status: Secondary | ICD-10-CM | POA: Insufficient documentation

## 2018-08-12 DIAGNOSIS — Z79899 Other long term (current) drug therapy: Secondary | ICD-10-CM | POA: Diagnosis not present

## 2018-08-12 DIAGNOSIS — R5383 Other fatigue: Secondary | ICD-10-CM

## 2018-08-12 DIAGNOSIS — Z7901 Long term (current) use of anticoagulants: Secondary | ICD-10-CM | POA: Insufficient documentation

## 2018-08-12 LAB — URINALYSIS, COMPLETE (UACMP) WITH MICROSCOPIC
BACTERIA UA: NONE SEEN
BILIRUBIN URINE: NEGATIVE
GLUCOSE, UA: NEGATIVE mg/dL
Ketones, ur: NEGATIVE mg/dL
LEUKOCYTES UA: NEGATIVE
NITRITE: NEGATIVE
PH: 5 (ref 5.0–8.0)
Protein, ur: 100 mg/dL — AB
Specific Gravity, Urine: 1.025 (ref 1.005–1.030)

## 2018-08-12 NOTE — ED Provider Notes (Signed)
[facility] ____________________________________________  Time seen: Approximately 6:52 PM  I have reviewed the triage vital signs and the nursing notes.   HISTORY  Chief Complaint Urinary Frequency   HPI Patricia Donaldson is a 77 y.o. female presenting with friend at bedside for evaluation of urinary frequency, fatigued and intermittent flushed feeling.  Denies any pain at this time.  Denies any recent fevers.  And further discussed with patient, patient states that she was sent to urgent care from her cardiologist for urinalysis and antibiotic.  Patient states that she received a phone call from follow-up labs from yesterday, stating that she had an elevated white blood cell count and that she needed an antibiotic.  Patient does report she has urinary frequency, however reports this is been ongoing for more than several months and has been worked up.  Further states that she also has intermittent flushed feeling and fatigue but states she feels that the fatigue is related to her sleep being broken up due to urinary frequency, and states that her doctor gave her trazodone to use as needed for sleep.  Patient also reports she has had a few dizzy episodes, with last being a few days ago, but reports this is also been an ongoing issue in which she was evaluated on 07/30/2018 in the emergency room and they stopped her blood pressure medication as reports of blood pressure dropping too low.  States she feels that her blood pressure is slightly back elevated today because she feels frustrated that she is in urgent care.  Denies any current dizziness.  Denies chest pain, shortness of breath, palpitations, syncope, near-syncope, rash, cough, sore throat, fevers, change in chronic urinary frequency, back pain, rash or other complaints.  Denies any acute change in symptoms.  Patient then further states she is unsure of why she is in urgent care.   Past Medical History:  Diagnosis Date  . Allergy     . Anxiety   . 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 Asthma   . Atrial fibrillation (HCC)   . Circulation problem   . Heart disease   . Hypertension   . TIA (transient ischemic attack)    2007  . Tinnitus     Patient Active Problem List   Diagnosis Date Noted  . Thyroid disease 03/10/2017  . Bleeding nose 01/13/2017  . Subacromial bursitis of left shoulder joint 12/19/2016  . Left shoulder pain 12/19/2016  . Chronic a-fib 01/02/2016  . 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 Chronic pulmonary hypertension (HCC) 01/03/2015  . Moderate mitral insufficiency 08/10/2014  . Stroke (HCC) 05/25/2014  . Venous stasis of lower extremity 05/20/2011  . TIA on medication 05/20/2011  . Osteopenia 05/20/2011  . Mixed hyperlipidemia 05/20/2011  . Benign essential hypertension 05/20/2011    Past Surgical History:  Procedure Laterality Date  . bladder prolapse  2008   no f/u with PT   . VAGINAL HYSTERECTOMY     cervix and uterus removed with bladder prolapse surgery      No current facility-administered medications for this encounter.   Current Outpatient Medications:  .  rivaroxaban (XARELTO) 20 MG TABS tablet, Take 20 mg by mouth daily with supper. , Disp: , Rfl:  .  losartan (COZAAR) 50 MG tablet, Take 50 mg by mouth daily., Disp: , Rfl:  .  mirabegron ER (MYRBETRIQ) 25 MG TB24 tablet, Take 1 tablet (25 mg total) by mouth daily. (Patient not taking: Reported on 07/30/2018),  Disp: 90 tablet, Rfl: 3 .  traZODone (DESYREL) 50 MG tablet, Take 50 mg by mouth at bedtime., Disp: , Rfl:   Allergies Erythromycin; Aspirin; Other; Penicillins; and Lobster [shellfish allergy]  Family History  Problem Relation Age of Onset  . Cancer Mother        stomach  . Breast cancer Maternal Grandmother   . Kidney disease Neg Hx   . Bladder Cancer Neg Hx     Social History Social History   Tobacco Use  . Smoking status: Former Smoker    Packs/day: 0.00    Last attempt to quit:  08/31/1959    Years since quitting: 58.9  . Smokeless tobacco: Never Used  Substance Use Topics  . Alcohol use: No  . Drug use: Not on file    Review of Systems Constitutional: No fever/chills Eyes: No visual changes. ENT: No sore throat. Cardiovascular: Denies chest pain. Respiratory: Denies shortness of breath. Gastrointestinal: No abdominal pain.  No nausea, no vomiting.  No diarrhea.   Genitourinary: As above.  Musculoskeletal: Negative for back pain. Skin: Negative for rash. Neurological: Negative for headaches, focal weakness or numbness.   ____________________________________________   PHYSICAL EXAM:  VITAL SIGNS: ED Triage Vitals  Enc Vitals Group     BP 08/12/18 1738 (!) 160/100     Pulse Rate 08/12/18 1738 79     Resp 08/12/18 1738 17     Temp 08/12/18 1738 98.2 F (36.8 C)     Temp Source 08/12/18 1738 Oral     SpO2 08/12/18 1738 99 %     Weight 08/12/18 1735 170 lb (77.1 kg)     Height 08/12/18 1735 5\' 3"  (1.6 m)     Head Circumference --      Peak Flow --      Pain Score 08/12/18 1735 0     Pain Loc --      Pain Edu? --      Excl. in GC? --     Constitutional: Alert and oriented. Well appearing and in no acute distress. Eyes: Conjunctivae are normal. PERRL.  ENT      Head: Normocephalic and atraumatic.      Nose: No congestion/rhinnorhea.      Mouth/Throat: Mucous membranes are moist.Oropharynx non-erythematous.  No tonsillar swelling or exudate. Neck: No stridor. Supple without meningismus.  Hematological/Lymphatic/Immunilogical: No cervical lymphadenopathy. Cardiovascular: Normal rate, regular rhythm. Grossly normal heart sounds.  Good peripheral circulation. Respiratory: Normal respiratory effort without tachypnea nor retractions. Breath sounds are clear and equal bilaterally. No wheezes, rales, rhonchi. Gastrointestinal: Soft and nontender.  No CVA tenderness. Musculoskeletal:  Nontender with normal range of motion in all extremities. No  midline cervical, thoracic or lumbar tenderness to palpation.  No lower extremity edema noted. Neurologic:  Normal speech and language. No gross focal neurologic deficits are appreciated. Speech is normal. No gait instability.  No paresthesias. Skin:  Skin is warm, dry and intact. No rash noted. Psychiatric: Mood and affect are normal. Speech and behavior are normal. Patient exhibits appropriate insight and judgment   ___________________________________________   LABS (all labs ordered are listed, but only abnormal results are displayed)  Labs Reviewed  URINALYSIS, COMPLETE (UACMP) WITH MICROSCOPIC - Abnormal; Notable for the following components:      Result Value   Hgb urine dipstick SMALL (*)    Protein, ur 100 (*)    All other components within normal limits  URINE CULTURE     PROCEDURES Procedures    INITIAL IMPRESSION /  ASSESSMENT AND PLAN / ED COURSE  Pertinent labs & imaging results that were available during my care of the patient were reviewed by me and considered in my medical decision making (see chart for details).  Patient very well-appearing.  In discussing with patient, she states that she was sent for antibiotic for UTI due to concern of infection of white blood cell count.  By care everywhere, patient did have a CBC with WBC 15.4 with neutrophil 13.72 and lymphocytes 0.93 from yesterday.  Patient exam well-appearing.  Urinalysis reviewed and discussed with patient not clear UTI and will culture.  Patient denies any other complaints that she has not already discussed with her primary, cardiologist or evaluated by emergency room.  No fevers.  Reports his continue remain active, eating and drinking without other changes.  Discussed with patient reevaluation of CBC, BMP and even evaluation of chest x-ray to evaluate for acute infection.  Patient declines stating she preferred to go ahead and go home at this time due to her frustration.  Patient verbalized understanding of  this.  Patient's request only to have urine culture performed at this time and states that she will follow-up outpatient with her primary care and cardiologist this week.  Discussed strict follow-up and return parameters.  Patient and friend verbalized understanding and agree.  Discussed follow up with Primary care physician this week. Discussed follow up and return parameters including no resolution or any worsening concerns. Patient verbalized understanding and agreed to plan.   ____________________________________________   FINAL CLINICAL IMPRESSION(S) / ED DIAGNOSES  Final diagnoses:  Urinary frequency     ED Discharge Orders    None       Note: This dictation was prepared with Dragon dictation along with smaller phrase technology. Any transcriptional errors that result from this process are unintentional.         Renford Dills, NP 08/12/18 2106

## 2018-08-12 NOTE — Discharge Instructions (Addendum)
You need to have very close follow-up with your primary care this week as discussed.  Return to urgent care or emergency room for worsening concerns.

## 2018-08-12 NOTE — ED Triage Notes (Signed)
Pt c/o urinary frequency, fatigue, flushed feeling, and urinary retention.

## 2018-08-14 LAB — URINE CULTURE: CULTURE: NO GROWTH

## 2018-09-04 ENCOUNTER — Ambulatory Visit (INDEPENDENT_AMBULATORY_CARE_PROVIDER_SITE_OTHER): Payer: Medicare Other | Admitting: Urology

## 2018-09-04 DIAGNOSIS — R350 Frequency of micturition: Secondary | ICD-10-CM

## 2018-09-04 NOTE — Progress Notes (Signed)
PTNS  Session # 1  Health & Social Factors: No change Caffeine: 0 Alcohol: 0 Daytime voids #per day: 6-8 Night-time voids #per night: 3-5 Urgency: strong Incontinence Episodes #per day: 0 Ankle used: Right Treatment Setting: 7 Feeling/ Response: Both Comments: Patient tolerated well  Preformed By: Teressa Lower, CMA  Follow Up: 1 week

## 2018-09-11 ENCOUNTER — Ambulatory Visit (INDEPENDENT_AMBULATORY_CARE_PROVIDER_SITE_OTHER): Payer: Medicare Other | Admitting: Urology

## 2018-09-11 DIAGNOSIS — R350 Frequency of micturition: Secondary | ICD-10-CM

## 2018-09-11 NOTE — Progress Notes (Signed)
PTNS  Session # 2  Health & Social Factors: no change Caffeine: 0 Alcohol: 0 Daytime voids #per day: 5-6 Night-time voids #per night: 6-8 Urgency: strong Incontinence Episodes #per day: 0 Ankle used: right Treatment Setting: 5 Feeling/ Response: both Comments: none  Preformed By: Eligha BridegroomSarah Vaidehi Braddy, CMA   Follow Up: next week

## 2018-09-18 ENCOUNTER — Ambulatory Visit (INDEPENDENT_AMBULATORY_CARE_PROVIDER_SITE_OTHER): Payer: Medicare Other | Admitting: Urology

## 2018-09-18 DIAGNOSIS — R351 Nocturia: Secondary | ICD-10-CM

## 2018-09-18 DIAGNOSIS — R350 Frequency of micturition: Secondary | ICD-10-CM | POA: Diagnosis not present

## 2018-09-18 NOTE — Progress Notes (Signed)
PTNS  Session # 3  Health & Social Factors: same Caffeine: 0 Alcohol: 0 Daytime voids #per day: 5-6 Night-time voids #per night: 6 Urgency: none Incontinence Episodes #per day: 0 Ankle used: right Treatment Setting: 4 Feeling/ Response: both Comments: none  Preformed By: Eligha BridegroomSarah Jd Mccaster, CMA  Follow Up: next week

## 2018-10-02 ENCOUNTER — Ambulatory Visit (INDEPENDENT_AMBULATORY_CARE_PROVIDER_SITE_OTHER): Payer: Medicare Other | Admitting: Urology

## 2018-10-02 ENCOUNTER — Other Ambulatory Visit: Payer: Self-pay

## 2018-10-02 DIAGNOSIS — R350 Frequency of micturition: Secondary | ICD-10-CM

## 2018-10-02 NOTE — Patient Instructions (Signed)
It is important to keep track of your urination during the day and night.  Please follow the directions of this diary.    Patient Name:______________________________________  Tracking your bladder symptoms Sample: Day Daytime Voids Number of Accidents Nighttime voids Urgenty for the day (0-4) Comments  Mon IIII 4 I 1 II 2 2 0=none-4=severe I had more coffee than usual today.   Week Starting:____________________________________ Day Daytime Voids Number of Accidents Nighttime voids Urgency for the day (0-4) Comments                                                                        This week my symptoms were:  O much better  O better O the same O worse  Week Starting:____________________________________ Day Daytime Voids Number of Accidents Nighttime voids Urgency for the day (0-4) Comments                                                                        This week my symptoms were:  O much better  O better O the same O worse

## 2018-10-02 NOTE — Progress Notes (Signed)
PTNS  Session # 4  Health & Social Factors: no change Caffeine: none Alcohol: none Daytime voids #per day: 3 Night-time voids #per night: 6-8 Urgency: moderate Incontinence Episodes #per day: none Ankle used: right Treatment Setting: 7 Feeling/ Response: reflex and patient response Comments: new voiding diary given to patient  Preformed By: Darrol AngelShannon Levens, CMA(AAMA)

## 2018-10-09 ENCOUNTER — Ambulatory Visit (INDEPENDENT_AMBULATORY_CARE_PROVIDER_SITE_OTHER): Payer: Medicare Other | Admitting: Urology

## 2018-10-09 DIAGNOSIS — R350 Frequency of micturition: Secondary | ICD-10-CM

## 2018-10-09 NOTE — Progress Notes (Signed)
PTNS  Session # 5  Health & Social Factors: no change Caffeine: none Alcohol: none Daytime voids #per day: 3 Night-time voids #per night: 7 Urgency: same Incontinence Episodes #per day: none Ankle used: right-patient request Treatment Setting: 9 Feeling/ Response: patient felt and toe twitch Comments: patient is not a good historian.  She is drinking sparkling cider about 7pm every night.  She is only keeping record of nightly bathroom trips and bowel movements.  Preformed By: Darrol AngelShannon Azaiah Mello, CMA(AAMA)   Follow Up: as directed

## 2018-10-09 NOTE — Patient Instructions (Signed)
Be sure to track your bathroom trips all day.  Also stop drinking sparkling juice before bed.  Stay Well!  Patient Name:______________________________________  Tracking your bladder symptoms Sample: Day Daytime Voids Number of Accidents Nighttime voids Urgenty for the day (0-4) Comments  Mon IIII 4 I 1 II 2 2 0=none-4=severe I had more coffee than usual today.   Week Starting:____________________________________ Day Daytime Voids Number of Accidents Nighttime voids Urgency for the day (0-4) Comments                                                                        This week my symptoms were:  O much better  O better O the same O worse  Week Starting:____________________________________ Day Daytime Voids Number of Accidents Nighttime voids Urgency for the day (0-4) Comments                                                                        This week my symptoms were:  O much better  O better O the same O worse

## 2018-10-16 ENCOUNTER — Ambulatory Visit (INDEPENDENT_AMBULATORY_CARE_PROVIDER_SITE_OTHER): Payer: Medicare Other | Admitting: Urology

## 2018-10-16 DIAGNOSIS — R350 Frequency of micturition: Secondary | ICD-10-CM | POA: Diagnosis not present

## 2018-10-16 NOTE — Progress Notes (Signed)
PTNS  Session # 6  Health & Social Factors: same Caffeine: 0 Alcohol: 0 Daytime voids #per day: 6 Night-time voids #per night: 5-6 Urgency: mild Incontinence Episodes #per day: 0 Ankle used: right Treatment Setting: 7 Feeling/ Response: sensory Comments: none  Preformed By: Eligha BridegroomSarah Anzal Bartnick, CMA    Follow Up: next week

## 2018-11-06 ENCOUNTER — Ambulatory Visit: Payer: Medicare Other | Admitting: Urology

## 2018-11-13 ENCOUNTER — Ambulatory Visit (INDEPENDENT_AMBULATORY_CARE_PROVIDER_SITE_OTHER): Payer: Medicare Other | Admitting: Urology

## 2018-11-13 DIAGNOSIS — R350 Frequency of micturition: Secondary | ICD-10-CM

## 2018-11-13 NOTE — Progress Notes (Signed)
PTNS  Session # 7  Health & Social Factors: same Caffeine: 0 Alcohol: 0 Daytime voids #per day: 4-5 Night-time voids #per night: 5-6 Urgency: mild Incontinence Episodes #per day: 0 Ankle used: right Treatment Setting: 4 Feeling/ Response: both Comments: none  Preformed By: Eligha Bridegroom, CMA   Follow Up: 1 Week

## 2018-11-13 NOTE — Patient Instructions (Signed)
Patient Name:______________________________________  Tracking your bladder symptoms °Sample: °Day Daytime Voids Number of Accidents Nighttime voids Urgenty for the day (0-4) Comments  °Mon IIII 4 I 1 II 2 2 °0=none-4=severe I had more coffee than usual today.  ° °Week Starting:____________________________________ °Day Daytime Voids Number of Accidents Nighttime voids Urgency for the day (0-4) Comments  °          °          °          °          °          °          °          °This week my symptoms were:  O much better  O better O the same O worse ° °Week Starting:____________________________________ °Day Daytime Voids Number of Accidents Nighttime voids Urgency for the day (0-4) Comments  °          °          °          °          °          °          °          °This week my symptoms were:  O much better  O better O the same O worse °

## 2018-11-20 ENCOUNTER — Ambulatory Visit (INDEPENDENT_AMBULATORY_CARE_PROVIDER_SITE_OTHER): Payer: Medicare Other | Admitting: Urology

## 2018-11-20 DIAGNOSIS — R350 Frequency of micturition: Secondary | ICD-10-CM

## 2018-11-20 NOTE — Progress Notes (Signed)
PTNS  Session # 5  Health & Social Factors: same Caffeine: 0 Alcohol: 0 Daytime voids #per day: 7-8 Night-time voids #per night: 7 Urgency: mild Incontinence Episodes #per day: 0 Ankle used: right Treatment Setting: 5 Feeling/ Response: both Comments: none  Preformed By: Eligha Bridegroom, CMA   Follow Up: 1 week

## 2018-11-20 NOTE — Patient Instructions (Signed)
Patient Name:______________________________________  Tracking your bladder symptoms °Sample: °Day Daytime Voids Number of Accidents Nighttime voids Urgenty for the day (0-4) Comments  °Mon IIII 4 I 1 II 2 2 °0=none-4=severe I had more coffee than usual today.  ° °Week Starting:____________________________________ °Day Daytime Voids Number of Accidents Nighttime voids Urgency for the day (0-4) Comments  °          °          °          °          °          °          °          °This week my symptoms were:  O much better  O better O the same O worse ° °Week Starting:____________________________________ °Day Daytime Voids Number of Accidents Nighttime voids Urgency for the day (0-4) Comments  °          °          °          °          °          °          °          °This week my symptoms were:  O much better  O better O the same O worse °

## 2018-11-27 ENCOUNTER — Ambulatory Visit (INDEPENDENT_AMBULATORY_CARE_PROVIDER_SITE_OTHER): Payer: Medicare Other | Admitting: Urology

## 2018-11-27 DIAGNOSIS — R350 Frequency of micturition: Secondary | ICD-10-CM | POA: Diagnosis not present

## 2018-11-27 NOTE — Progress Notes (Signed)
PTNS  Session # 6  Health & Social Factors: same Caffeine: 0 Alcohol: 0 Daytime voids #per day: 6 Night-time voids #per night: 6-8 Urgency: mild Incontinence Episodes #per day: 0 Ankle used: right Treatment Setting: 4 Feeling/ Response: sensory Comments: none  Preformed By: Eligha Bridegroom, CMA  Follow Up: 1 week

## 2018-11-27 NOTE — Patient Instructions (Signed)
Patient Name:______________________________________  Tracking your bladder symptoms °Sample: °Day Daytime Voids Number of Accidents Nighttime voids Urgenty for the day (0-4) Comments  °Mon IIII 4 I 1 II 2 2 °0=none-4=severe I had more coffee than usual today.  ° °Week Starting:____________________________________ °Day Daytime Voids Number of Accidents Nighttime voids Urgency for the day (0-4) Comments  °          °          °          °          °          °          °          °This week my symptoms were:  O much better  O better O the same O worse ° °Week Starting:____________________________________ °Day Daytime Voids Number of Accidents Nighttime voids Urgency for the day (0-4) Comments  °          °          °          °          °          °          °          °This week my symptoms were:  O much better  O better O the same O worse °

## 2018-12-04 ENCOUNTER — Ambulatory Visit: Payer: Medicare Other | Admitting: Urology

## 2018-12-11 ENCOUNTER — Ambulatory Visit: Payer: Medicare Other | Admitting: Urology

## 2018-12-18 ENCOUNTER — Ambulatory Visit: Payer: Medicare Other | Admitting: Urology

## 2018-12-25 ENCOUNTER — Ambulatory Visit (INDEPENDENT_AMBULATORY_CARE_PROVIDER_SITE_OTHER): Payer: Medicare Other | Admitting: Urology

## 2018-12-25 DIAGNOSIS — R351 Nocturia: Secondary | ICD-10-CM

## 2018-12-25 DIAGNOSIS — R350 Frequency of micturition: Secondary | ICD-10-CM

## 2018-12-25 NOTE — Progress Notes (Signed)
PTNS  Session # 7  Health & Social Factors: same Caffeine: 0 Alcohol: 0 Daytime voids #per day: 6 Night-time voids #per night: 6-8 Urgency: mild Incontinence Episodes #per day: 0 Ankle used: right Treatment Setting: 1 Feeling/ Response: both Comments: none  Preformed By: Eligha Bridegroom, CMA   Follow Up: 1 week

## 2018-12-25 NOTE — Patient Instructions (Signed)
Patient Name:______________________________________  Tracking your bladder symptoms °Sample: °Day Daytime Voids Number of Accidents Nighttime voids Urgenty for the day (0-4) Comments  °Mon IIII 4 I 1 II 2 2 °0=none-4=severe I had more coffee than usual today.  ° °Week Starting:____________________________________ °Day Daytime Voids Number of Accidents Nighttime voids Urgency for the day (0-4) Comments  °          °          °          °          °          °          °          °This week my symptoms were:  O much better  O better O the same O worse ° °Week Starting:____________________________________ °Day Daytime Voids Number of Accidents Nighttime voids Urgency for the day (0-4) Comments  °          °          °          °          °          °          °          °This week my symptoms were:  O much better  O better O the same O worse °

## 2019-01-01 ENCOUNTER — Ambulatory Visit (INDEPENDENT_AMBULATORY_CARE_PROVIDER_SITE_OTHER): Payer: Medicare Other | Admitting: Urology

## 2019-01-01 DIAGNOSIS — R351 Nocturia: Secondary | ICD-10-CM | POA: Diagnosis not present

## 2019-01-01 DIAGNOSIS — R350 Frequency of micturition: Secondary | ICD-10-CM

## 2019-01-01 NOTE — Progress Notes (Signed)
PTNS  Session # 8  Health & Social Factors: same Caffeine: 0 Alcohol: 0 Daytime voids #per day: 6 Night-time voids #per night: 6 Urgency: mild Incontinence Episodes #per day: none Ankle used: right  Treatment Setting: 10 Feeling/ Response: sensory Comments: none  Preformed By: Eligha Bridegroom, CMA    Follow Up: 1 wk

## 2019-01-01 NOTE — Patient Instructions (Signed)
Patient Name:______________________________________  Tracking your bladder symptoms °Sample: °Day Daytime Voids Number of Accidents Nighttime voids Urgenty for the day (0-4) Comments  °Mon IIII 4 I 1 II 2 2 °0=none-4=severe I had more coffee than usual today.  ° °Week Starting:____________________________________ °Day Daytime Voids Number of Accidents Nighttime voids Urgency for the day (0-4) Comments  °          °          °          °          °          °          °          °This week my symptoms were:  O much better  O better O the same O worse ° °Week Starting:____________________________________ °Day Daytime Voids Number of Accidents Nighttime voids Urgency for the day (0-4) Comments  °          °          °          °          °          °          °          °This week my symptoms were:  O much better  O better O the same O worse °

## 2019-01-08 ENCOUNTER — Other Ambulatory Visit: Payer: Self-pay

## 2019-01-08 ENCOUNTER — Ambulatory Visit (INDEPENDENT_AMBULATORY_CARE_PROVIDER_SITE_OTHER): Payer: Medicare Other | Admitting: Urology

## 2019-01-08 DIAGNOSIS — R351 Nocturia: Secondary | ICD-10-CM

## 2019-01-08 DIAGNOSIS — R350 Frequency of micturition: Secondary | ICD-10-CM | POA: Diagnosis not present

## 2019-01-08 NOTE — Progress Notes (Signed)
PTNS  Session # 9  Health & Social Factors: no change Caffeine: 0 Alcohol: 0 Daytime voids #per day: 6 Night-time voids #per night: 6 Urgency: mild Incontinence Episodes #per day: none Ankle used: right Treatment Setting: 10 Feeling/ Response: sensory Comments: patient tolerated well  Preformed By: Teressa Lower, CMA  Follow Up: 1 week

## 2019-01-15 ENCOUNTER — Ambulatory Visit: Payer: Medicare Other | Admitting: Urology

## 2019-01-29 ENCOUNTER — Telehealth: Payer: Self-pay | Admitting: Urology

## 2019-01-29 NOTE — Telephone Encounter (Signed)
Pt LMOM and would like for [name] to give her a call back concerning PTNS treatments beginning in May.

## 2019-02-03 NOTE — Telephone Encounter (Signed)
Spoke with patient and told her we have not starting rescheduling ptns yet due to [place] but will contact her when we restart

## 2019-03-26 ENCOUNTER — Ambulatory Visit: Payer: Self-pay | Admitting: Urology

## 2019-04-09 ENCOUNTER — Telehealth: Payer: Self-pay | Admitting: Urology

## 2019-04-09 NOTE — Telephone Encounter (Signed)
She was restarted and stopped on session number 9 so she needs 3 more and then maint.

## 2019-04-09 NOTE — Telephone Encounter (Signed)
Can you check for ma and let me know I have checked and as far as I can tell she has had all 12 of her PTNS treatments? Looks like she needs to be scheduled for maintance?   Sharyn Lull

## 2019-05-14 ENCOUNTER — Ambulatory Visit: Payer: Self-pay | Admitting: Urology

## 2019-06-01 ENCOUNTER — Telehealth: Payer: Self-pay | Admitting: Urology

## 2019-06-01 NOTE — Telephone Encounter (Signed)
-----   Message from Hollice Espy, MD sent at 06/01/2019  3:23 PM EDT ----- Regarding: RE: PTNS Just finish up then maintenance ----- Message ----- From: Benard Halsted Sent: 06/01/2019   3:01 PM EDT To: Hollice Espy, MD Subject: PTNS                                           This patient was 3 appts short of completing her PTNS treatments before COVID. Do you want her to finish or restart? Or does she need an OV to discuss?   Sharyn Lull

## 2019-06-01 NOTE — Telephone Encounter (Signed)
LM TO CB TO SCHEDULE  Patricia Donaldson

## 2019-07-20 ENCOUNTER — Other Ambulatory Visit: Payer: Self-pay | Admitting: Neurology

## 2019-07-20 DIAGNOSIS — R4701 Aphasia: Secondary | ICD-10-CM

## 2019-07-23 NOTE — Telephone Encounter (Signed)
Tried reaching patient to reschedule her last 3 missed PTNS appts no return call   Sharyn Lull

## 2019-07-28 ENCOUNTER — Ambulatory Visit
Admission: RE | Admit: 2019-07-28 | Discharge: 2019-07-28 | Disposition: A | Discharge status: Home / Self Care | Payer: Medicare Other | Source: Ambulatory Visit | Attending: Neurology | Admitting: Neurology

## 2019-07-28 ENCOUNTER — Other Ambulatory Visit: Payer: Self-pay

## 2019-07-28 DIAGNOSIS — R4701 Aphasia: Secondary | ICD-10-CM | POA: Diagnosis not present

## 2020-05-04 IMAGING — CT CT HEAD W/O CM
3 of 4 series · 15 of 47 positions shown, 18 images · non-contrast
Comparison: Impression of an outside head CT 05/05/2019 (no images
available).

Head CT 07/30/2018.

CLINICAL DATA: 78-year-old female status post fall at home 1 month
ago.

EXAM:
CT HEAD WITHOUT CONTRAST
TECHNIQUE: Contiguous axial images were obtained from the base of the skull
through the vertex without intravenous contrast.

[Series 4: head · axial · 0.39mm/px · z∈[-578,-458]mm · 9 of 76 slices shown, 12 images (1 of 3)]
[im 8/76  brain]
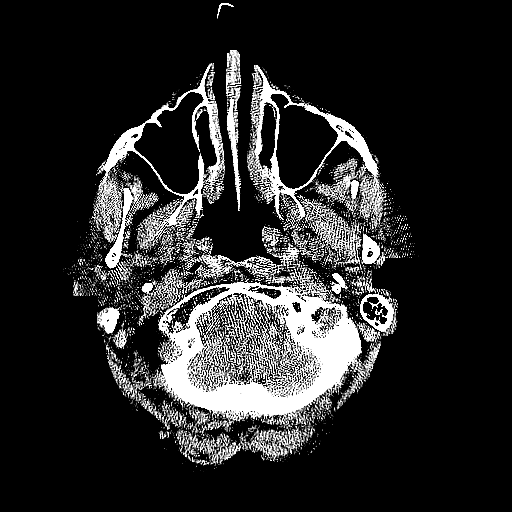
[im 8/76  bone]
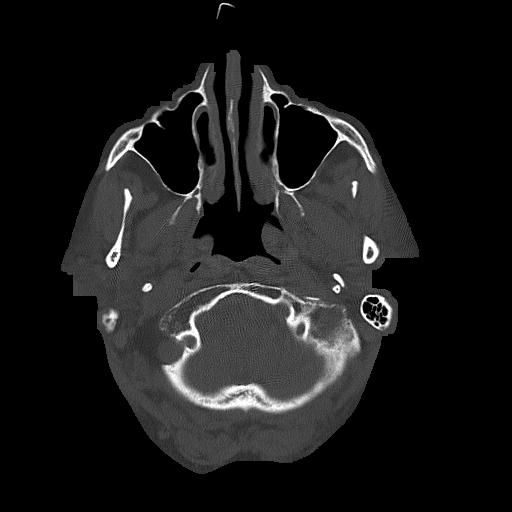
[im 15/76  brain]
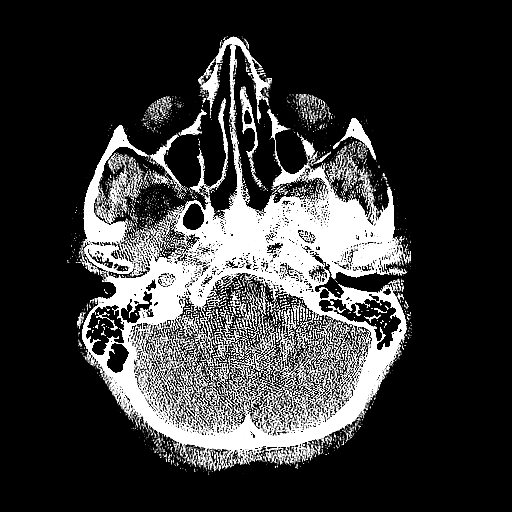
[im 22/76  brain]
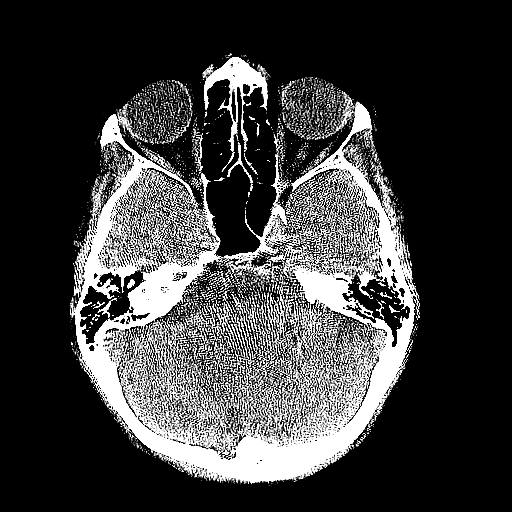
[im 29/76  brain]
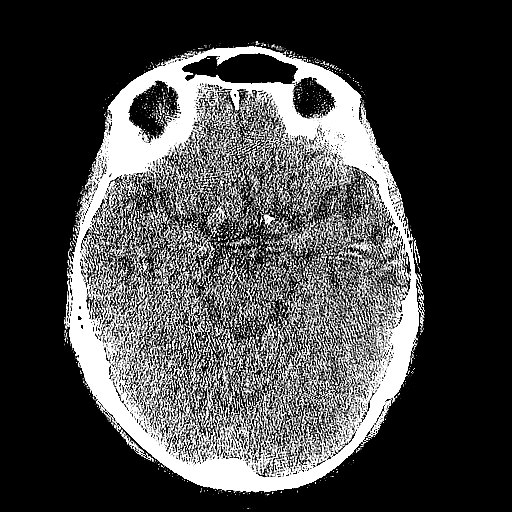
[im 40/76  brain]
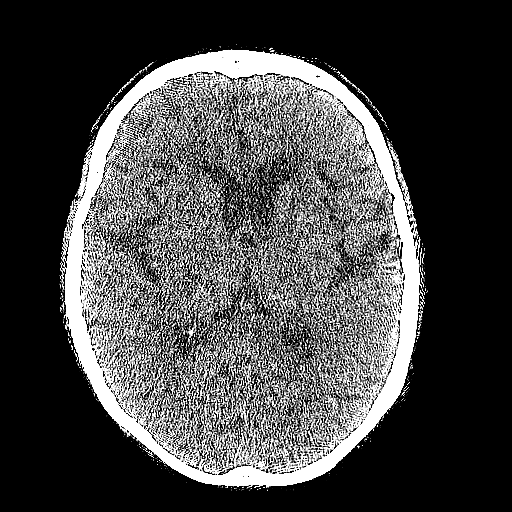
[im 40/76  bone]
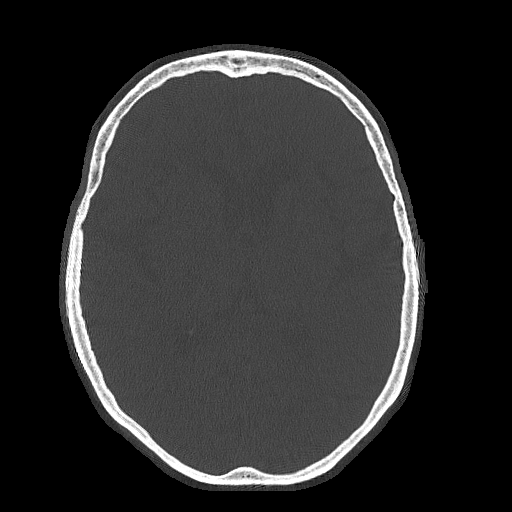
[im 47/76  brain]
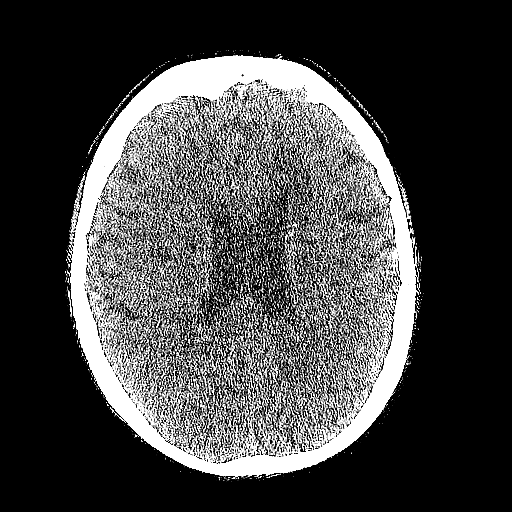
[im 54/76  brain]
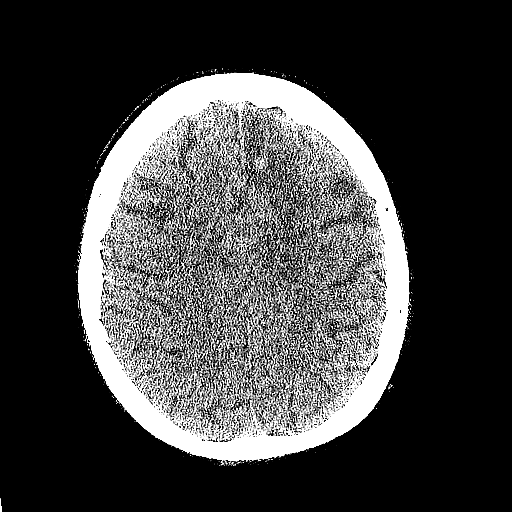
[im 61/76  brain]
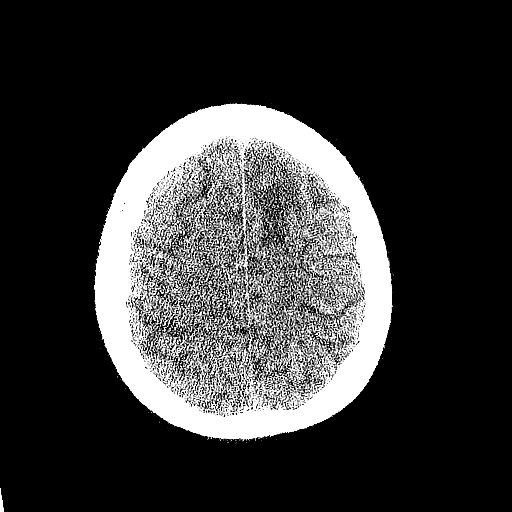
[im 68/76  brain]
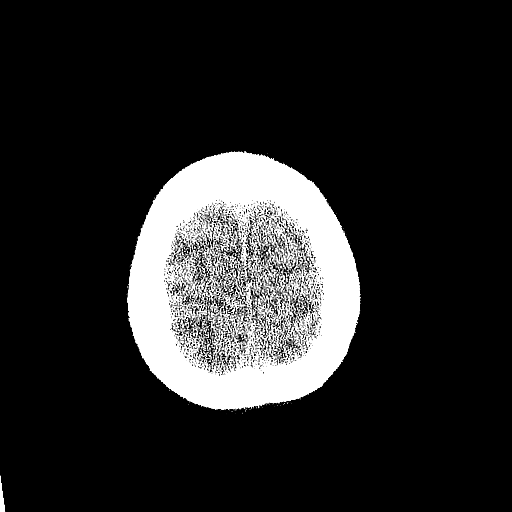
[im 68/76  bone]
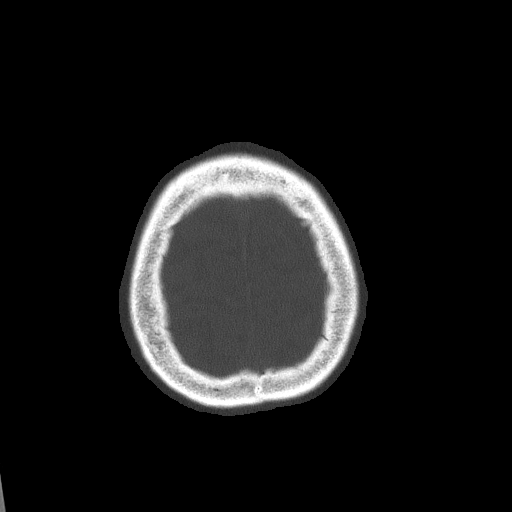

[Series 6: head · coronal · 0.31mm/px · 3 of 62 slices shown (2 of 3)]
[im 21/62  brain]
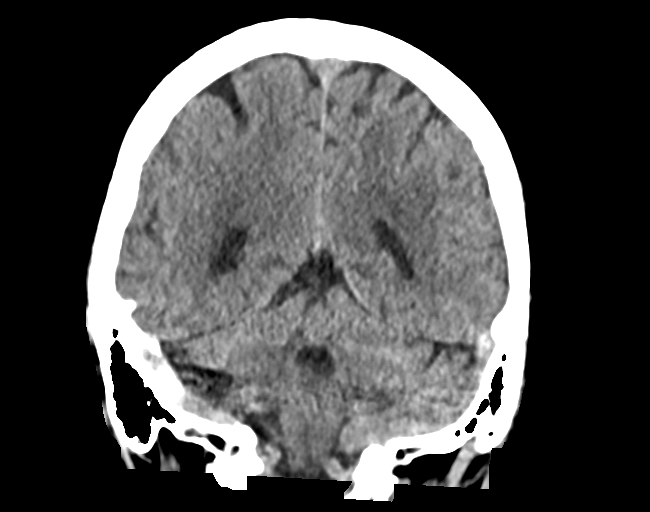
[im 28/62  brain]
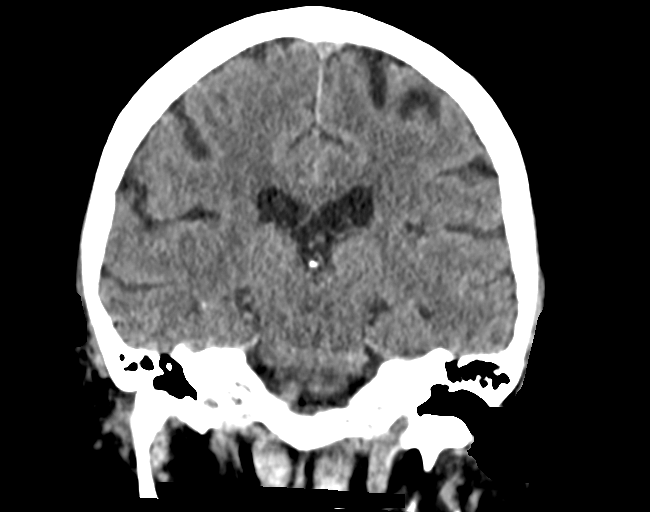
[im 34/62  brain]
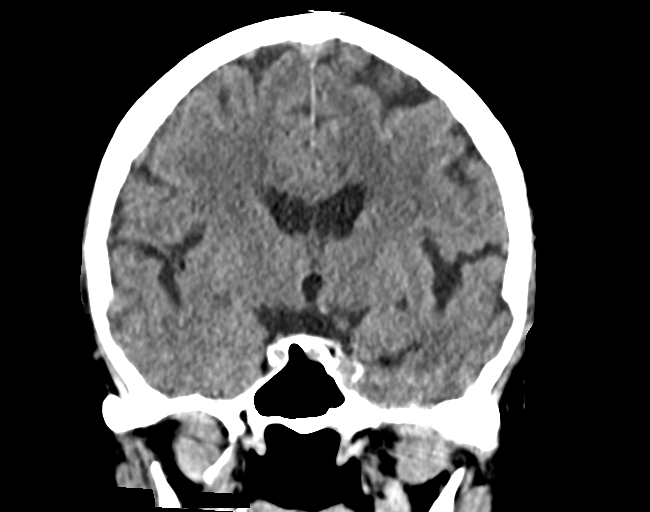

[Series 8: head · sagittal · 0.31mm/px · 3 of 52 slices shown (3 of 3)]
[im 18/52  brain]
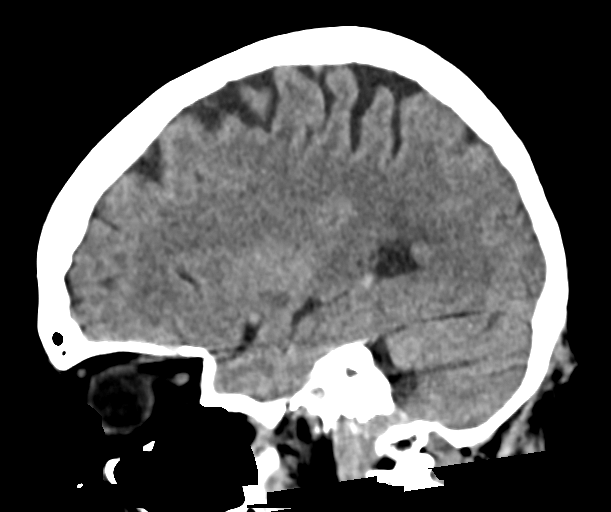
[im 26/52  brain]
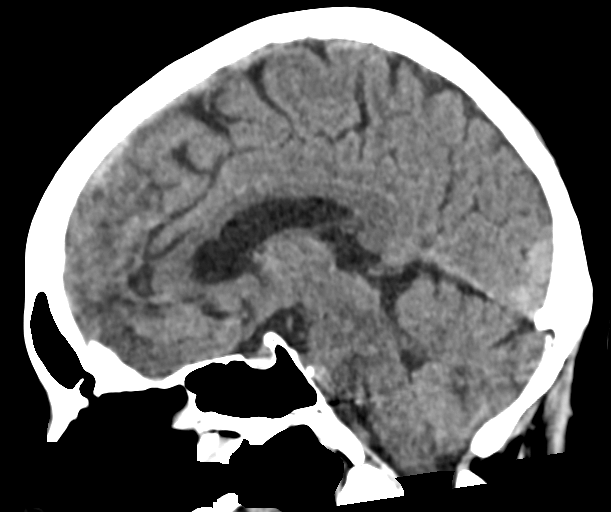
[im 35/52  brain]
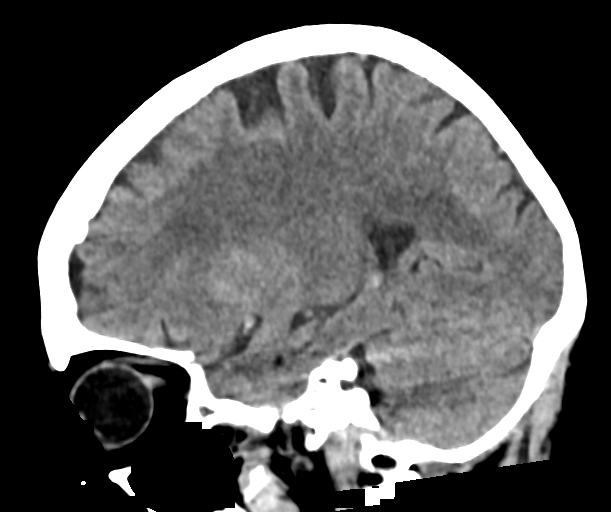

[15 of 47 positions shown; findings below may reference images not displayed]

FINDINGS: Brain: Confluent new encephalomalacia in the left ACA territory
since July 2018 compatible with interval infarct. Small areas of
laminar necrosis or perhaps petechial hemorrhage (series 2, image
22). No mass effect.

No superimposed acute intracranial hemorrhage identified. Mild ex
vacuo enlargement of the left lateral ventricle. Stable gray-white
matter differentiation elsewhere, within normal limits for age.

No midline shift, ventriculomegaly, mass effect, evidence of mass
lesion, or evidence of cortically based acute infarction.

Vascular: Calcified atherosclerosis at the skull base. No suspicious
intracranial vascular hyperdensity.

Skull: Stable, negative.

Sinuses/Orbits: Visualized paranasal sinuses and mastoids are stable
and well pneumatized.

Other: No acute orbit or scalp soft tissue findings.
IMPRESSION: 1. Chronic appearing left ACA territory infarct is new since 7572.
Associated mild laminar necrosis or petechial hemorrhage.
2. No superimposed acute intracranial abnormality.

## 2021-01-25 ENCOUNTER — Other Ambulatory Visit: Payer: Self-pay | Admitting: Internal Medicine

## 2021-01-25 DIAGNOSIS — I63421 Cerebral infarction due to embolism of right anterior cerebral artery: Secondary | ICD-10-CM

## 2021-01-25 DIAGNOSIS — I10 Essential (primary) hypertension: Secondary | ICD-10-CM

## 2021-01-29 ENCOUNTER — Other Ambulatory Visit: Payer: Self-pay

## 2021-01-29 ENCOUNTER — Ambulatory Visit
Admission: RE | Admit: 2021-01-29 | Discharge: 2021-01-29 | Disposition: A | Discharge status: Home / Self Care | Payer: Medicare Other | Source: Ambulatory Visit | Attending: Internal Medicine | Admitting: Internal Medicine

## 2021-01-29 DIAGNOSIS — I63421 Cerebral infarction due to embolism of right anterior cerebral artery: Secondary | ICD-10-CM | POA: Insufficient documentation

## 2021-01-29 DIAGNOSIS — I10 Essential (primary) hypertension: Secondary | ICD-10-CM | POA: Diagnosis present

## 2021-02-24 ENCOUNTER — Other Ambulatory Visit: Payer: Self-pay

## 2021-02-24 ENCOUNTER — Emergency Department: Payer: Medicare Other

## 2021-02-24 ENCOUNTER — Emergency Department
Admission: EM | Admit: 2021-02-24 | Discharge: 2021-02-25 | Disposition: A | Discharge status: Home / Self Care | Payer: Medicare Other | Attending: Emergency Medicine | Admitting: Emergency Medicine

## 2021-02-24 DIAGNOSIS — I119 Hypertensive heart disease without heart failure: Secondary | ICD-10-CM | POA: Insufficient documentation

## 2021-02-24 DIAGNOSIS — J45909 Unspecified asthma, uncomplicated: Secondary | ICD-10-CM | POA: Diagnosis not present

## 2021-02-24 DIAGNOSIS — Z7901 Long term (current) use of anticoagulants: Secondary | ICD-10-CM | POA: Insufficient documentation

## 2021-02-24 DIAGNOSIS — R5383 Other fatigue: Secondary | ICD-10-CM | POA: Diagnosis not present

## 2021-02-24 DIAGNOSIS — Z87891 Personal history of nicotine dependence: Secondary | ICD-10-CM | POA: Diagnosis not present

## 2021-02-24 DIAGNOSIS — I4891 Unspecified atrial fibrillation: Secondary | ICD-10-CM | POA: Insufficient documentation

## 2021-02-24 DIAGNOSIS — R531 Weakness: Secondary | ICD-10-CM | POA: Diagnosis not present

## 2021-02-24 DIAGNOSIS — Z8673 Personal history of transient ischemic attack (TIA), and cerebral infarction without residual deficits: Secondary | ICD-10-CM | POA: Insufficient documentation

## 2021-02-24 DIAGNOSIS — E079 Disorder of thyroid, unspecified: Secondary | ICD-10-CM | POA: Diagnosis not present

## 2021-02-24 DIAGNOSIS — T50991A Poisoning by other drugs, medicaments and biological substances, accidental (unintentional), initial encounter: Secondary | ICD-10-CM | POA: Diagnosis present

## 2021-02-24 DIAGNOSIS — T50901A Poisoning by unspecified drugs, medicaments and biological substances, accidental (unintentional), initial encounter: Secondary | ICD-10-CM

## 2021-02-24 DIAGNOSIS — N390 Urinary tract infection, site not specified: Secondary | ICD-10-CM

## 2021-02-24 LAB — CBC WITH DIFFERENTIAL/PLATELET
Abs Immature Granulocytes: 0.03 10*3/uL (ref 0.00–0.07)
Basophils Absolute: 0 10*3/uL (ref 0.0–0.1)
Basophils Relative: 0 %
Eosinophils Absolute: 0.2 10*3/uL (ref 0.0–0.5)
Eosinophils Relative: 3 %
HCT: 44.7 % (ref 36.0–46.0)
Hemoglobin: 14.3 g/dL (ref 12.0–15.0)
Immature Granulocytes: 0 %
Lymphocytes Relative: 23 %
Lymphs Abs: 1.7 10*3/uL (ref 0.7–4.0)
MCH: 28.9 pg (ref 26.0–34.0)
MCHC: 32 g/dL (ref 30.0–36.0)
MCV: 90.3 fL (ref 80.0–100.0)
Monocytes Absolute: 0.7 10*3/uL (ref 0.1–1.0)
Monocytes Relative: 9 %
Neutro Abs: 4.7 10*3/uL (ref 1.7–7.7)
Neutrophils Relative %: 65 %
Platelets: 224 10*3/uL (ref 150–400)
RBC: 4.95 MIL/uL (ref 3.87–5.11)
RDW: 13.4 % (ref 11.5–15.5)
WBC: 7.3 10*3/uL (ref 4.0–10.5)
nRBC: 0 % (ref 0.0–0.2)

## 2021-02-24 LAB — COMPREHENSIVE METABOLIC PANEL
ALT: 18 U/L (ref 0–44)
AST: 21 U/L (ref 15–41)
Albumin: 4.1 g/dL (ref 3.5–5.0)
Alkaline Phosphatase: 52 U/L (ref 38–126)
Anion gap: 9 (ref 5–15)
BUN: 27 mg/dL — ABNORMAL HIGH (ref 8–23)
CO2: 26 mmol/L (ref 22–32)
Calcium: 9.2 mg/dL (ref 8.9–10.3)
Chloride: 106 mmol/L (ref 98–111)
Creatinine, Ser: 0.65 mg/dL (ref 0.44–1.00)
GFR, Estimated: >60 mL/min (ref >60)
Glucose, Bld: 95 mg/dL (ref 70–99)
Potassium: 4.2 mmol/L (ref 3.5–5.1)
Sodium: 141 mmol/L (ref 135–145)
Total Bilirubin: 0.7 mg/dL (ref 0.3–1.2)
Total Protein: 7.3 g/dL (ref 6.5–8.1)

## 2021-02-24 LAB — URINALYSIS, COMPLETE (UACMP) WITH MICROSCOPIC
Bacteria, UA: NONE SEEN
Bilirubin Urine: NEGATIVE
Glucose, UA: NEGATIVE mg/dL
Hgb urine dipstick: NEGATIVE
Ketones, ur: NEGATIVE mg/dL
Nitrite: NEGATIVE
Protein, ur: NEGATIVE mg/dL
Specific Gravity, Urine: 1.018 (ref 1.005–1.030)
pH: 5 (ref 5.0–8.0)

## 2021-02-24 LAB — TROPONIN I (HIGH SENSITIVITY): Troponin I (High Sensitivity): 10 ng/L (ref <18)

## 2021-02-24 MED ORDER — SODIUM CHLORIDE 0.9 % IV BOLUS
1000.0000 mL | Freq: Once | INTRAVENOUS | Status: AC
  Administered 2021-02-24: 1000 mL via INTRAVENOUS

## 2021-02-24 NOTE — ED Notes (Signed)
Spoke to son Madelaine Bhat) on phone. Pt states that pt is at baseline. Short term memory is nonexistent since stroke in 03/2019. About 5:15 pm pt was fine and son noticed that carvedilol 6.25mg  x2 daily, rosuvastatin 10mg , levetiracetan 500mg  x2 daily. Donepezil 5mg  her had been missing. Son states that 2/4 days worth are missing.

## 2021-02-24 NOTE — Discharge Instructions (Addendum)
Take antibiotic as prescribed.  Please follow-up with your doctor for recheck/reevaluation in 2 to 3 days.  Return to the emergency department for any symptoms personally concerning to yourself or family members.

## 2021-02-24 NOTE — ED Triage Notes (Signed)
PT states that she started to become weak around noon today. Pt lives with son and is unsure if she took her medications. Pt has a hx of CVA.

## 2021-02-24 NOTE — ED Provider Notes (Signed)
[facility] Emergency Department Provider Note  Time seen: 7:52 PM  I have reviewed the triage vital signs and the nursing notes.   HISTORY  Chief Complaint Weakness   HPI Patricia Donaldson is a 80 y.o. female with a past medical history of anxiety, asthma, atrial fibrillation, hypertension, TIA, presents to the emergency department for weakness.  According to report since around 1 PM today patient has been feeling very weak.  Patient denies any fever cough congestion or shortness of breath.  No vomiting diarrhea or dysuria.  Denies any focal area of weakness but states a sensation of generalized fatigue.  Son states he believes the patient took 2-4 days worth of her medications this morning accidentally.  Past Medical History:  Diagnosis Date  . Allergy   . Anxiety   . 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 Asthma   . Atrial fibrillation (HCC)   . Circulation problem   . Heart disease   . Hypertension   . TIA (transient ischemic attack)    2007  . Tinnitus     Patient Active Problem List   Diagnosis Date Noted  . Urinary frequency 10/02/2018  . Thyroid disease 03/10/2017  . Bleeding nose 01/13/2017  . Subacromial bursitis of left shoulder joint 12/19/2016  . Left shoulder pain 12/19/2016  . Chronic a-fib (HCC) 01/02/2016  . 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 Chronic pulmonary hypertension (HCC) 01/03/2015  . Moderate mitral insufficiency 08/10/2014  . Stroke (HCC) 05/25/2014  . Venous stasis of lower extremity 05/20/2011  . TIA on medication 05/20/2011  . Osteopenia 05/20/2011  . Mixed hyperlipidemia 05/20/2011  . Benign essential hypertension 05/20/2011    Past Surgical History:  Procedure Laterality Date  . bladder prolapse  2008   no f/u with PT   . VAGINAL HYSTERECTOMY     cervix and uterus removed with bladder prolapse surgery     Prior to Admission medications   Medication Sig Start Date End Date Taking? Authorizing  Provider  rivaroxaban (XARELTO) 20 MG TABS tablet Take 20 mg by mouth daily with supper.     [provider]  traZODone (DESYREL) 50 MG tablet Take 50 mg by mouth at bedtime.    [provider]    Allergies  Allergen Reactions  . Erythromycin Hives and Rash  . Aspirin   . Other Other (See Comments)    Medication which has discolored lower extremity/feet.  Marland Kitchen Penicillins Hives    Has patient had a PCN reaction causing immediate rash, facial/tongue/throat swelling, SOB or lightheadedness with hypotension: Yes Has patient had a PCN reaction causing severe rash involving mucus membranes or skin necrosis: No Has patient had a PCN reaction that required hospitalization: No Has patient had a PCN reaction occurring within the last 10 years: No If all of the above answers are "NO", then may proceed with Cephalosporin use.  Josefina Do Allergy] Hives and Rash    Family History  Problem Relation Age of Onset  . Cancer Mother        stomach  . Breast cancer Maternal Grandmother   . Kidney disease Neg Hx   . Bladder Cancer Neg Hx     Social History Social History   Tobacco Use  . Smoking status: Former Smoker    Packs/day: 0.00    Quit date: 08/31/1959    Years since quitting: 61.5  . Smokeless tobacco: Never Used  Vaping Use  . Vaping Use:  Never used  Substance Use Topics  . Alcohol use: No    Review of Systems Constitutional: Negative for fever.  Generalized weakness. Cardiovascular: Negative for chest pain. Respiratory: Negative for shortness of breath. Gastrointestinal: Negative for abdominal pain, vomiting Musculoskeletal: Negative for musculoskeletal complaints Neurological: Negative for headache All other ROS negative  ____________________________________________   PHYSICAL EXAM:  VITAL SIGNS: ED Triage Vitals  Enc Vitals Group     BP 02/24/21 1918 (!) 145/86     Pulse Rate 02/24/21 1918 70     Resp 02/24/21 1918 18     Temp 02/24/21  1918 99.1 F (37.3 C)     Temp Source 02/24/21 1918 Oral     SpO2 02/24/21 1907 98 %     Weight 02/24/21 1919 162 lb 7.7 oz (73.7 kg)     Height 02/24/21 1919 5\' 5"  (1.651 m)     Head Circumference --      Peak Flow --      Pain Score 02/24/21 1919 0     Pain Loc --      Pain Edu? --      Excl. in GC? --     Constitutional: Alert. Well appearing and in no distress. Eyes: Normal exam ENT      Head: Normocephalic and atraumatic.      Mouth/Throat: Mucous membranes are moist. Cardiovascular: Normal rate, regular rhythm.  Respiratory: Normal respiratory effort without tachypnea nor retractions. Breath sounds are clear  Gastrointestinal: Soft and nontender. No distention.   Musculoskeletal: Nontender with normal range of motion in all extremities.  Neurologic:  Normal speech and language. No gross focal neurologic deficits  Skin:  Skin is warm, dry and intact.  Psychiatric: Mood and affect are normal.   ____________________________________________    EKG  EKG viewed and interpreted by myself shows atrial fibrillation at 70 bpm with a narrow QRS, normal axis, normal intervals, nonspecific ST changes.  No ST elevation.  ____________________________________________    RADIOLOGY  CT scan head is negative for acute abnormality.  ____________________________________________   INITIAL IMPRESSION / ASSESSMENT AND PLAN / ED COURSE  Pertinent labs & imaging results that were available during my care of the patient were reviewed by me and considered in my medical decision making (see chart for details).   Patient presents emergency department for generalized weakness and fatigue starting around 1 PM today.  Son who is here now with the patient states it is possible that she took 2 or more days worth of her medications this morning by accident.  Patient denies any focal deficits.  Does have a history of CVA in the past we will obtain a head CT as a precaution.  We will check labs,  urinalysis we will IV hydrate and continue to closely monitor.  We will maintain patient on cardiac monitoring throughout her stay in the emergency department.  I spoke to the patient's son over the phone.  He states he puts the patient's pills into a pillbox for her, had 2 to 4 days worth of pills in there and they were gone.  Patient does not know if she took them or what happened to the pills but the son assumes that she possibly did take them at approximately 30 minutes to an hour after he found the empty pill box she began saying that she was not feeling well.  He was concerned that she could have possibly taken the pills so he brought her to the emergency department.  Patient's current  medication regimen is rosuvastatin, Keppra, donepezil and carvedilol.  Reassuringly patient's heart rate remains normal, blood pressure normal.  We will monitor for 6 hours in the emergency department.  Lungs the patient continues to appear well I believe she would be safe for discharge home.  Patient's work-up thus far in the emergency department including CT scan of the head lab work and urine is overall reassuring.  NIMCO BIVENS was evaluated in Emergency Department on 02/24/2021 for the symptoms described in the history of present illness. She was evaluated in the context of the global COVID-19 pandemic, which necessitated consideration that the patient might be at risk for infection with the SARS-CoV-2 virus that causes COVID-19. Institutional protocols and algorithms that pertain to the evaluation of patients at risk for COVID-19 are in a state of rapid change based on information released by regulatory bodies including the CDC and federal and state organizations. These policies and algorithms were followed during the patient's care in the ED.  ____________________________________________   FINAL CLINICAL IMPRESSION(S) / ED DIAGNOSES  Weakness Accidental overdose   Minna Antis, MD 02/24/21 2252

## 2021-02-25 DIAGNOSIS — T50991A Poisoning by other drugs, medicaments and biological substances, accidental (unintentional), initial encounter: Secondary | ICD-10-CM | POA: Diagnosis not present

## 2021-02-25 LAB — TROPONIN I (HIGH SENSITIVITY): Troponin I (High Sensitivity): 10 ng/L (ref <18)

## 2021-02-25 MED ORDER — NITROFURANTOIN MONOHYD MACRO 100 MG PO CAPS
100.0000 mg | ORAL_CAPSULE | Freq: Once | ORAL | Status: AC
  Administered 2021-02-25: 100 mg via ORAL
  Filled 2021-02-25: qty 1

## 2021-02-25 MED ORDER — NITROFURANTOIN MONOHYD MACRO 100 MG PO CAPS: 100.0000 mg | ORAL_CAPSULE | Freq: Two times a day (BID) | ORAL | 0 refills | Status: DC

## 2021-02-25 NOTE — ED Notes (Signed)
Patient denies pain and is resting comfortably.  

## 2021-02-25 NOTE — ED Provider Notes (Signed)
-----------------------------------------   1:47 AM on 02/25/2021 -----------------------------------------  Heart rate remains above 60 and patient remains hemodynamically stable after 6 hours of observation in the ED.  Macrobid started for UTI.  Patient voices no complaints currently.  Strict return precautions given.  Patient verbalizes understanding agrees with plan of care.   Irean Hong, MD 02/25/21 [phone]

## 2021-02-26 LAB — URINE CULTURE: Culture: >=100000 — AB

## 2021-11-06 IMAGING — CT CT HEAD W/O CM
3 of 4 series · 13 of 47 positions shown, 15 images · non-contrast
Comparison: 07/28/2019

CLINICAL DATA: Dizziness and unsteady gait. Memory loss over the
last 2 weeks. History of previous strokes.

EXAM:
CT HEAD WITHOUT CONTRAST
TECHNIQUE: Contiguous axial images were obtained from the base of the skull
through the vertex without intravenous contrast.

[Series 2: axial st head 5.00 ax · axial · 0.31mm/px · z∈[-565,-471]mm · 7 of 27 slices shown, 9 images]
[im 4/27  brain]
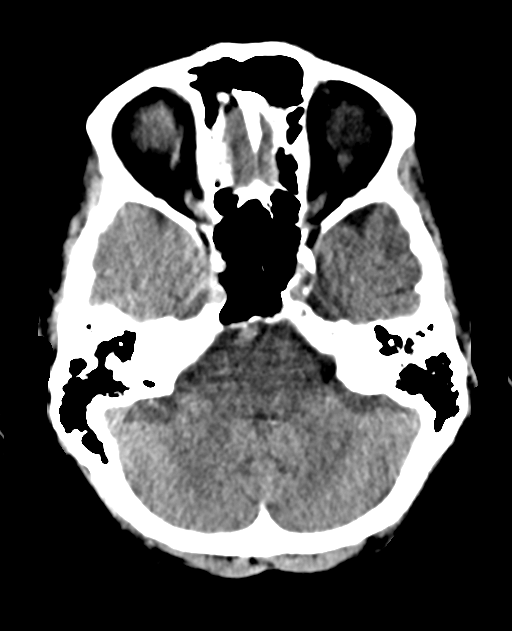
[im 4/27  bone]
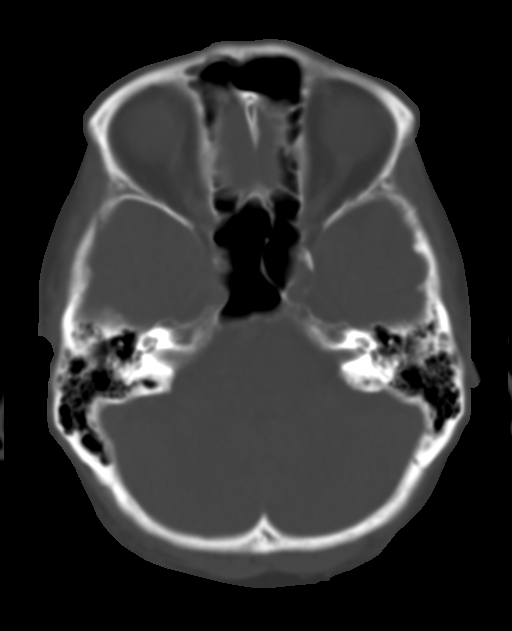
[im 7/27  brain]
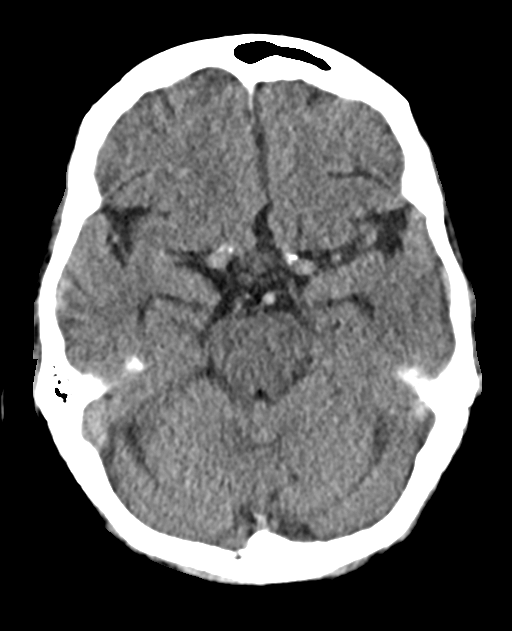
[im 10/27  brain]
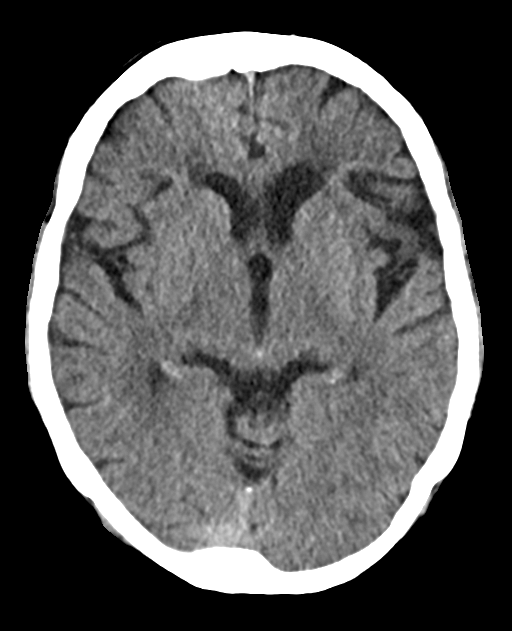
[im 14/27  brain]
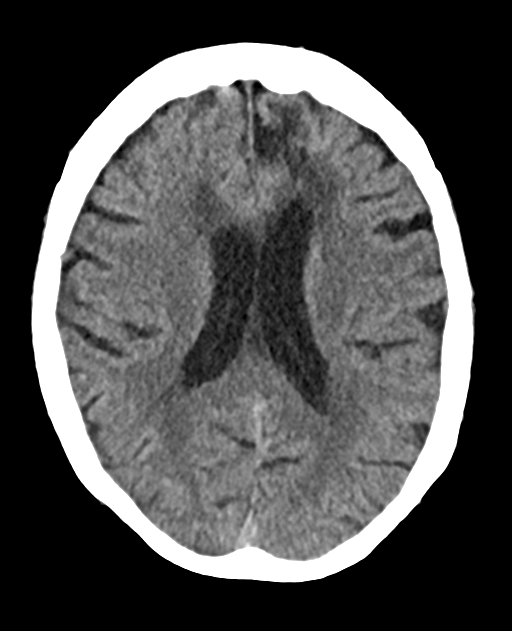
[im 17/27  brain]
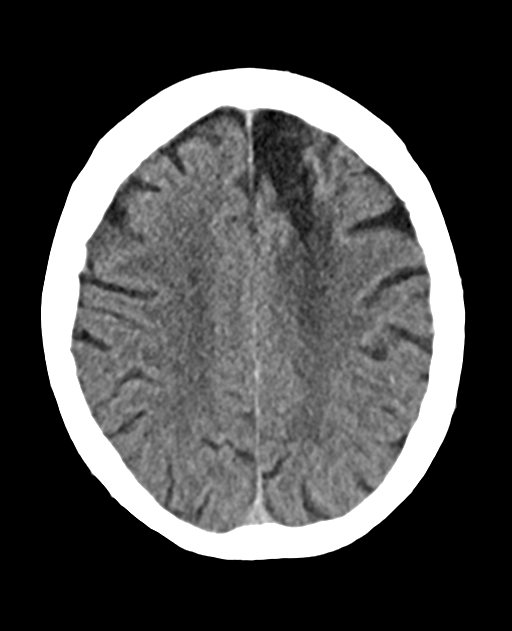
[im 17/27  bone]
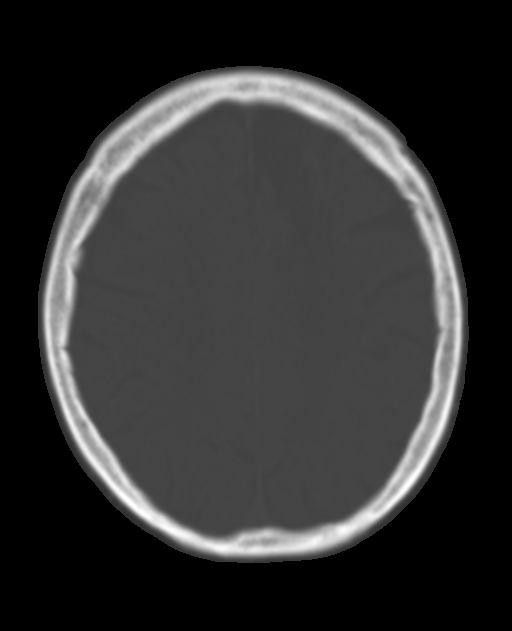
[im 20/27  brain]
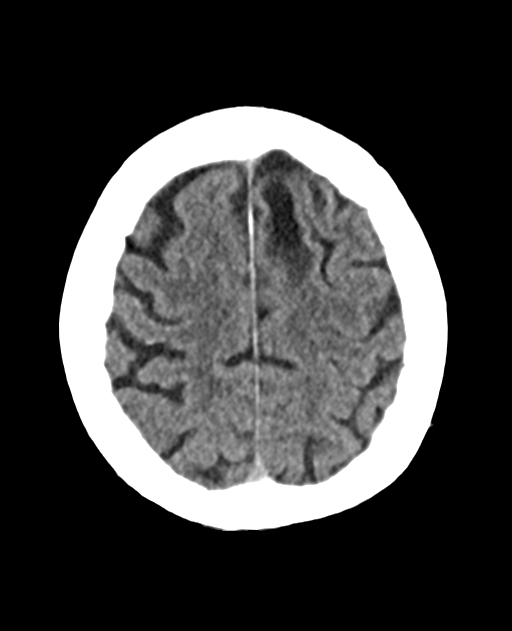
[im 23/27  brain]
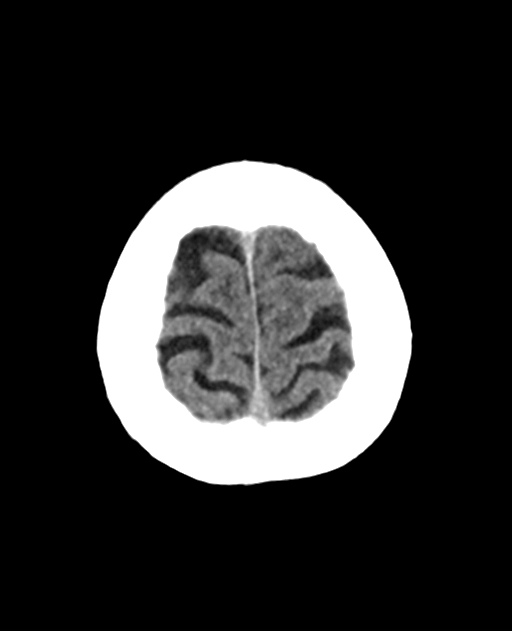

[Series 6: coronals head 3.00 cor · coronal · 0.27mm/px · 3 of 64 slices shown]
[im 22/64  brain]
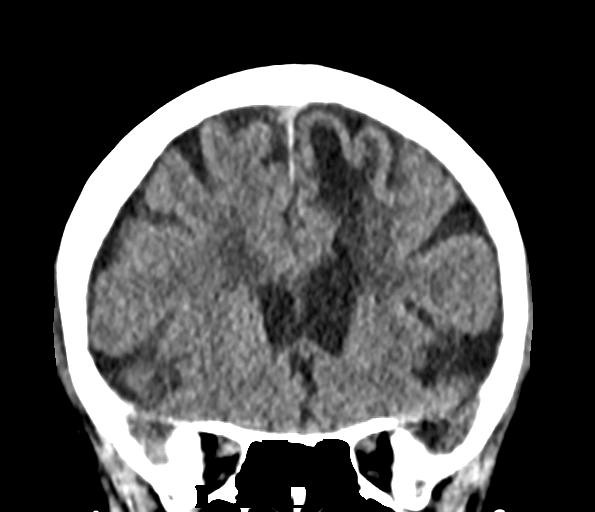
[im 29/64  brain]
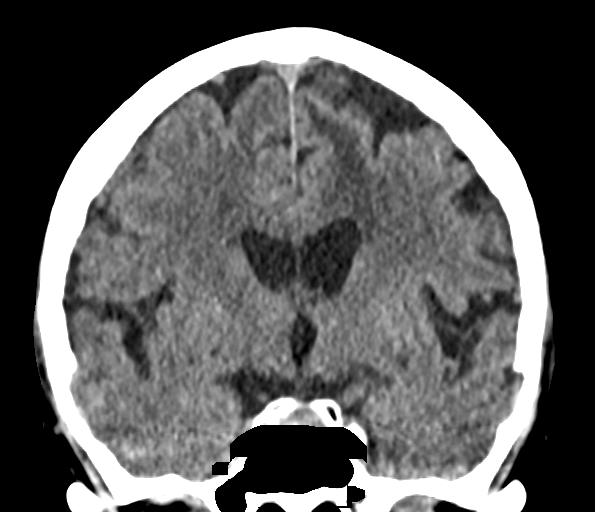
[im 36/64  brain]
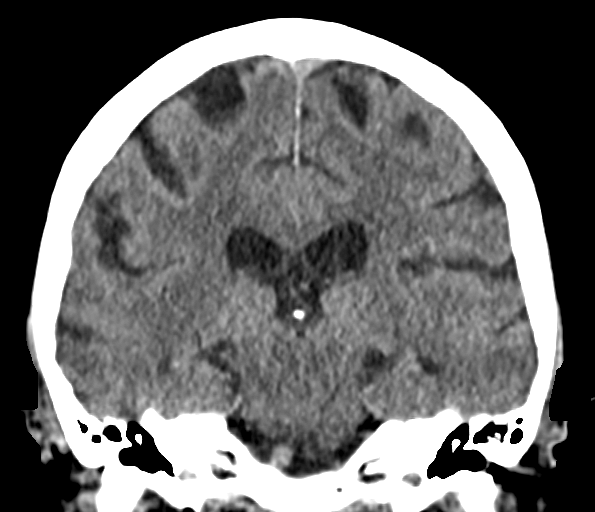

[Series 8: sagittals head 3.00 sag · sagittal · 0.27mm/px · 3 of 53 slices shown]
[im 18/53  brain]
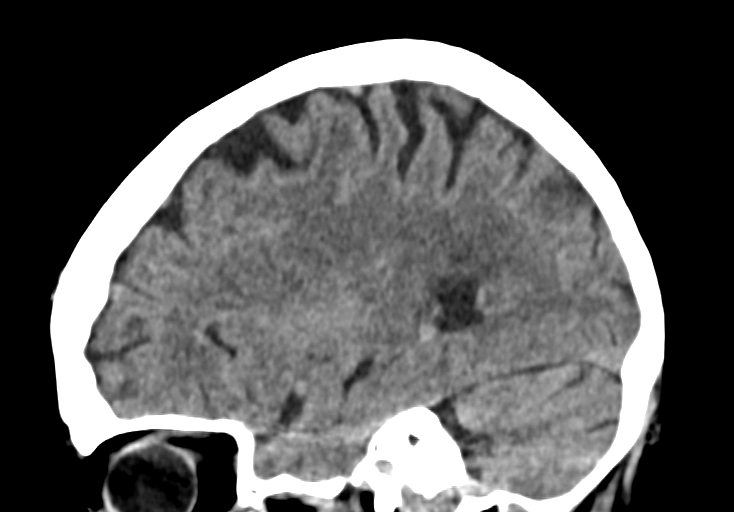
[im 27/53  brain]
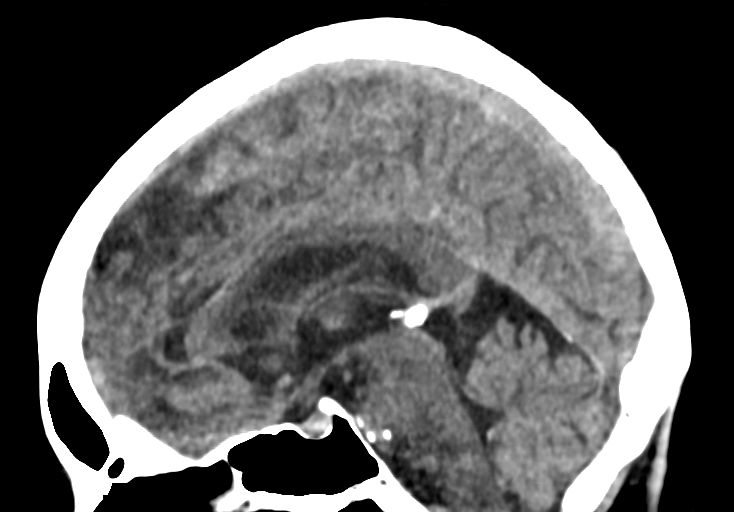
[im 35/53  brain]
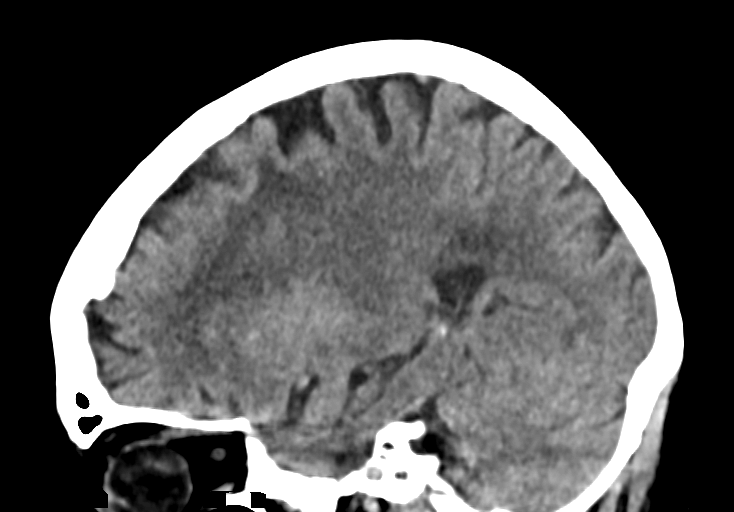

[13 of 47 positions shown; findings below may reference images not displayed]

FINDINGS: Brain: Brainstem and cerebellum are normal. Right cerebral
hemisphere shows mild chronic small-vessel ischemic changes of the
white matter. Left cerebral hemisphere shows deep white matter small
vessel changes as well as a region of left frontal encephalomalacia
most consistent with an old infarction. Some possibility this could
be post traumatic. No evidence of extension or new infarction. No
hemorrhage, hydrocephalus or extra-axial collection.

Vascular: There is atherosclerotic calcification of the major
vessels at the base of the brain.

Skull: Normal

Sinuses/Orbits: Clear/normal

Other: None
IMPRESSION: No acute finding by CT. Old left frontal infarction. Chronic
small-vessel ischemic changes of the cerebral hemispheric white
matter.

## 2022-04-26 ENCOUNTER — Other Ambulatory Visit: Payer: Self-pay

## 2022-04-26 ENCOUNTER — Emergency Department
Admission: EM | Admit: 2022-04-26 | Discharge: 2022-04-26 | Disposition: A | Discharge status: Home / Self Care | Payer: Medicare Other | Attending: Emergency Medicine | Admitting: Emergency Medicine

## 2022-04-26 DIAGNOSIS — R451 Restlessness and agitation: Secondary | ICD-10-CM | POA: Diagnosis present

## 2022-04-26 DIAGNOSIS — R4189 Other symptoms and signs involving cognitive functions and awareness: Secondary | ICD-10-CM

## 2022-04-26 DIAGNOSIS — I10 Essential (primary) hypertension: Secondary | ICD-10-CM | POA: Diagnosis not present

## 2022-04-26 DIAGNOSIS — F03911 Unspecified dementia, unspecified severity, with agitation: Secondary | ICD-10-CM | POA: Diagnosis not present

## 2022-04-26 DIAGNOSIS — N3000 Acute cystitis without hematuria: Secondary | ICD-10-CM | POA: Diagnosis not present

## 2022-04-26 DIAGNOSIS — R41840 Attention and concentration deficit: Secondary | ICD-10-CM | POA: Insufficient documentation

## 2022-04-26 LAB — CBC
HCT: 42.6 % (ref 36.0–46.0)
Hemoglobin: 13 g/dL (ref 12.0–15.0)
MCH: 27.7 pg (ref 26.0–34.0)
MCHC: 30.5 g/dL (ref 30.0–36.0)
MCV: 90.8 fL (ref 80.0–100.0)
Platelets: 297 10*3/uL (ref 150–400)
RBC: 4.69 MIL/uL (ref 3.87–5.11)
RDW: 13.2 % (ref 11.5–15.5)
WBC: 7.9 10*3/uL (ref 4.0–10.5)
nRBC: 0 % (ref 0.0–0.2)

## 2022-04-26 LAB — BASIC METABOLIC PANEL
Anion gap: 5 (ref 5–15)
BUN: 21 mg/dL (ref 8–23)
CO2: 28 mmol/L (ref 22–32)
Calcium: 8.8 mg/dL — ABNORMAL LOW (ref 8.9–10.3)
Chloride: 106 mmol/L (ref 98–111)
Creatinine, Ser: 0.64 mg/dL (ref 0.44–1.00)
GFR, Estimated: >60 mL/min (ref >60)
Glucose, Bld: 124 mg/dL — ABNORMAL HIGH (ref 70–99)
Potassium: 3.8 mmol/L (ref 3.5–5.1)
Sodium: 139 mmol/L (ref 135–145)

## 2022-04-26 LAB — URINALYSIS, ROUTINE W REFLEX MICROSCOPIC
Bilirubin Urine: NEGATIVE
Glucose, UA: NEGATIVE mg/dL
Hgb urine dipstick: NEGATIVE
Ketones, ur: NEGATIVE mg/dL
Nitrite: NEGATIVE
Protein, ur: 30 mg/dL — AB
Specific Gravity, Urine: 1.019 (ref 1.005–1.030)
pH: 5 (ref 5.0–8.0)

## 2022-04-26 MED ORDER — FOSFOMYCIN TROMETHAMINE 3 G PO PACK
3.0000 g | PACK | ORAL | Status: AC
  Administered 2022-04-26: 3 g via ORAL
  Filled 2022-04-26: qty 3

## 2022-04-26 NOTE — ED Notes (Signed)
TTS AND psych NP in with patient and family.

## 2022-04-26 NOTE — ED Notes (Signed)
Pt received cold tray and a ginger ale per request
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
## 2022-04-26 NOTE — Discharge Instructions (Addendum)
Patient was treated with a single dose antibiotic called fosfomycin to treat urinary tract infection.  Additional dosing should not be required unless the patient continues to have symptoms such as burning, fever, lower abdominal pain or other concerns.  Please follow-up with her primary care physician early this coming week.

## 2022-04-26 NOTE — ED Notes (Signed)
Spoke with patient's son, Madelaine Bhat, and let him know she was in the ED.

## 2022-04-26 NOTE — ED Notes (Signed)
Report called to [facility]. 

## 2022-10-19 ENCOUNTER — Emergency Department
Admission: EM | Admit: 2022-10-19 | Discharge: 2022-10-19 | Disposition: A | Discharge status: Skilled Nursing Facility | Payer: Medicare Other | Attending: Emergency Medicine | Admitting: Emergency Medicine

## 2022-10-19 ENCOUNTER — Encounter: Payer: Self-pay | Admitting: Emergency Medicine

## 2022-10-19 ENCOUNTER — Emergency Department: Payer: Medicare Other

## 2022-10-19 DIAGNOSIS — R0602 Shortness of breath: Secondary | ICD-10-CM | POA: Diagnosis not present

## 2022-10-19 DIAGNOSIS — I10 Essential (primary) hypertension: Secondary | ICD-10-CM | POA: Insufficient documentation

## 2022-10-19 DIAGNOSIS — Z1152 Encounter for screening for COVID-19: Secondary | ICD-10-CM | POA: Insufficient documentation

## 2022-10-19 DIAGNOSIS — I4891 Unspecified atrial fibrillation: Secondary | ICD-10-CM | POA: Diagnosis not present

## 2022-10-19 LAB — RESP PANEL BY RT-PCR (RSV, FLU A&B, COVID)  RVPGX2
Influenza A by PCR: NEGATIVE
Influenza B by PCR: NEGATIVE
Resp Syncytial Virus by PCR: NEGATIVE
SARS Coronavirus 2 by RT PCR: NEGATIVE

## 2022-10-19 LAB — BASIC METABOLIC PANEL
Anion gap: 8 (ref 5–15)
BUN: 22 mg/dL (ref 8–23)
CO2: 29 mmol/L (ref 22–32)
Calcium: 8.7 mg/dL — ABNORMAL LOW (ref 8.9–10.3)
Chloride: 105 mmol/L (ref 98–111)
Creatinine, Ser: 0.61 mg/dL (ref 0.44–1.00)
GFR, Estimated: >60 mL/min (ref >60)
Glucose, Bld: 101 mg/dL — ABNORMAL HIGH (ref 70–99)
Potassium: 4 mmol/L (ref 3.5–5.1)
Sodium: 142 mmol/L (ref 135–145)

## 2022-10-19 LAB — CBC
HCT: 42.6 % (ref 36.0–46.0)
Hemoglobin: 12.7 g/dL (ref 12.0–15.0)
MCH: 26.6 pg (ref 26.0–34.0)
MCHC: 29.8 g/dL — ABNORMAL LOW (ref 30.0–36.0)
MCV: 89.3 fL (ref 80.0–100.0)
Platelets: 240 10*3/uL (ref 150–400)
RBC: 4.77 MIL/uL (ref 3.87–5.11)
RDW: 14.6 % (ref 11.5–15.5)
WBC: 7.3 10*3/uL (ref 4.0–10.5)
nRBC: 0 % (ref 0.0–0.2)

## 2022-10-19 LAB — TROPONIN I (HIGH SENSITIVITY)
Troponin I (High Sensitivity): 14 ng/L (ref <18)
Troponin I (High Sensitivity): 13 ng/L (ref <18)

## 2022-10-19 MED ORDER — ALBUTEROL SULFATE HFA 108 (90 BASE) MCG/ACT IN AERS: 2.0000 | INHALATION_SPRAY | Freq: Four times a day (QID) | RESPIRATORY_TRACT | 0 refills | Status: DC | PRN

## 2022-10-19 NOTE — ED Provider Notes (Signed)
[facility] Provider Note    Event Date/Time   First MD Initiated Contact with Patient 10/19/22 [phone]     (approximate)   History   Chief Complaint Shortness of Breath   HPI  Patricia Donaldson is a 81 y.o. female with past medical history of hypertension, atrial fibrillation, stroke, and cognitive impairment who presents to the ED complaining of shortness of breath.  Nursing staff at [facility] had called EMS due to concern for difficulty breathing, although patient does not recall this and states that she feels fine.  She is not sure why she is here and currently denies any complaints.  Per EMS, patient initially found to be 90 to 93% on room air, was placed on 2 L nasal cannula for about 15 minutes with improvement.  Since arrival to the ED, patient has been on room air with oxygen saturations of 97 to 98%.  Patient does not recall any difficulty breathing or chest pain, denies any recent fevers or cough.     Physical Exam   Triage Vital Signs: ED Triage Vitals [10/19/22 0639]  Enc Vitals Group     BP (!) 167/97     Pulse Rate 70     Resp 20     Temp 98 F (36.7 C)     Temp Source Oral     SpO2 98 %     Weight 152 lb 8.9 oz (69.2 kg)     Height 5\' 4"  (1.626 m)     Head Circumference      Peak Flow      Pain Score      Pain Loc      Pain Edu?      Excl. in [place]?     Most recent vital signs: Vitals:   10/19/22 0639  BP: (!) 167/97  Pulse: 70  Resp: 20  Temp: 98 F (36.7 C)  SpO2: 98%    Constitutional: Alert and oriented. Eyes: Conjunctivae are normal. Head: Atraumatic. Nose: No congestion/rhinnorhea. Mouth/Throat: Mucous membranes are moist.  Cardiovascular: Normal rate, regular rhythm. Grossly normal heart sounds.  2+ radial pulses bilaterally. Respiratory: Normal respiratory effort.  No retractions. Lungs CTAB. Gastrointestinal: Soft and nontender. No distention. Musculoskeletal: No lower extremity tenderness, 1+ pitting edema to  knees bilaterally. Neurologic:  Normal speech and language. No gross focal neurologic deficits are appreciated.    ED Results / Procedures / Treatments   Labs (all labs ordered are listed, but only abnormal results are displayed) Labs Reviewed  BASIC METABOLIC PANEL - Abnormal; Notable for the following components:      Result Value   Glucose, Bld 101 (*)    Calcium 8.7 (*)    All other components within normal limits  CBC - Abnormal; Notable for the following components:   MCHC 29.8 (*)    All other components within normal limits  RESP PANEL BY RT-PCR (RSV, FLU A&B, COVID)  RVPGX2  TROPONIN I (HIGH SENSITIVITY)  TROPONIN I (HIGH SENSITIVITY)     EKG  ED ECG REPORT I, Blake Divine, the attending physician, personally viewed and interpreted this ECG.   Date: 10/19/2022  EKG Time: 6:41  Rate: 71  Rhythm: normal sinus rhythm  Axis: RAD  Intervals:none  ST&T Change: None  RADIOLOGY Chest x-ray reviewed and interpreted by me with no infiltrate, edema, or effusion.  PROCEDURES:  Critical Care performed: No  Procedures   MEDICATIONS ORDERED IN ED: Medications - No data to display  IMPRESSION / MDM / ASSESSMENT AND PLAN / ED COURSE  I reviewed the triage vital signs and the nursing notes.                              81 y.o. female with past medical history of hypertension, atrial fibrillation, stroke, and cognitive impairment who presents to the ED for episode of difficulty breathing at her nursing facility that she does not recall.  Patient's presentation is most consistent with acute presentation with potential threat to life or bodily function.  Differential diagnosis includes, but is not limited to, ACS, arrhythmia, PE, pneumonia, pneumothorax, COPD, CHF, viral syndrome.  Patient well-appearing and in no acute distress, vital signs are unremarkable.  EKG shows no evidence of arrhythmia or ischemia and initial troponin is negative, overall low suspicion  for ACS but we will check second set troponin given reported acute onset.  Remainder of labs are reassuring with no significant anemia, leukocytosis, lecture abnormality, or AKI.  Chest x-ray is also unremarkable.  No symptoms to suggest infectious process at this time and if repeat troponin is negative, patient would be appropriate for discharge back to her nursing facility.  Repeat troponin within normal limits and patient is appropriate for discharge back to nursing facility.  Patient counseled to return to the ED for new or worsening symptoms, patient agrees with plan.      FINAL CLINICAL IMPRESSION(S) / ED DIAGNOSES   Final diagnoses:  SOB (shortness of breath)     Rx / DC Orders   ED Discharge Orders     None        Note:  This document was prepared using Dragon voice recognition software and may include unintentional dictation errors.   Chesley Noon, MD 10/19/22 [phone]

## 2022-10-19 NOTE — ED Notes (Signed)
Son was contacted st he would come pick up hs mother Patricia Donaldson

## 2022-10-19 NOTE — ED Notes (Signed)
Pt getting OOB again to "get dressed". Helped back into bed. Pt still confused.

## 2022-10-19 NOTE — ED Triage Notes (Signed)
Pt presents via EMS  with complaints of [facility] for SOB that started tonight per staff. With EMS the patient was 90-93% on RA - placed on 2L for ~15 mins and her sats improved to 97%. Pt is currently 97% on RA without dyspnea nor tachypnea. Pt denies any sx nor complaints at this time.   VS with EMS 97% on RA 87 HR 192/117

## 2022-10-19 NOTE — ED Notes (Signed)
Pt got out of bed again and does not understand that she has a pureqick and just urinated. Pt refuses to get back in bed and insists to use toilet. Helped to hallway bathroom.

## 2022-10-19 NOTE — ED Notes (Signed)
Pt was trying to get OOB. Got pt back into bed.

## 2022-10-19 NOTE — ED Notes (Signed)
ED sec was able to reach son of pt who will come for pt to bring back to [facility]. Pt also got OOB again and was helped back into bed.

## 2023-11-12 ENCOUNTER — Emergency Department (HOSPITAL_COMMUNITY): Payer: Medicare Other

## 2023-11-12 ENCOUNTER — Inpatient Hospital Stay (HOSPITAL_COMMUNITY)
Admission: EM | Admit: 2023-11-12 | Discharge: 2023-11-21 | DRG: 481 | Disposition: A | Discharge status: Skilled Nursing Facility | Payer: Medicare Other | Source: Skilled Nursing Facility | Attending: Internal Medicine | Admitting: Internal Medicine

## 2023-11-12 ENCOUNTER — Encounter (HOSPITAL_COMMUNITY): Payer: Self-pay | Admitting: Pharmacy Technician

## 2023-11-12 ENCOUNTER — Other Ambulatory Visit: Payer: Self-pay

## 2023-11-12 DIAGNOSIS — F419 Anxiety disorder, unspecified: Secondary | ICD-10-CM | POA: Diagnosis not present

## 2023-11-12 DIAGNOSIS — Z79899 Other long term (current) drug therapy: Secondary | ICD-10-CM

## 2023-11-12 DIAGNOSIS — I10 Essential (primary) hypertension: Secondary | ICD-10-CM | POA: Diagnosis present

## 2023-11-12 DIAGNOSIS — Z6826 Body mass index (BMI) 26.0-26.9, adult: Secondary | ICD-10-CM

## 2023-11-12 DIAGNOSIS — W19XXXA Unspecified fall, initial encounter: Secondary | ICD-10-CM | POA: Diagnosis present

## 2023-11-12 DIAGNOSIS — D62 Acute posthemorrhagic anemia: Secondary | ICD-10-CM | POA: Diagnosis not present

## 2023-11-12 DIAGNOSIS — Z7901 Long term (current) use of anticoagulants: Secondary | ICD-10-CM | POA: Diagnosis not present

## 2023-11-12 DIAGNOSIS — S72142A Displaced intertrochanteric fracture of left femur, initial encounter for closed fracture: Secondary | ICD-10-CM | POA: Diagnosis not present

## 2023-11-12 DIAGNOSIS — Z91013 Allergy to seafood: Secondary | ICD-10-CM | POA: Diagnosis not present

## 2023-11-12 DIAGNOSIS — M4854XA Collapsed vertebra, not elsewhere classified, thoracic region, initial encounter for fracture: Secondary | ICD-10-CM | POA: Diagnosis present

## 2023-11-12 DIAGNOSIS — J45909 Unspecified asthma, uncomplicated: Secondary | ICD-10-CM | POA: Diagnosis not present

## 2023-11-12 DIAGNOSIS — Z66 Do not resuscitate: Secondary | ICD-10-CM | POA: Diagnosis present

## 2023-11-12 DIAGNOSIS — Z515 Encounter for palliative care: Secondary | ICD-10-CM | POA: Diagnosis not present

## 2023-11-12 DIAGNOSIS — S7290XA Unspecified fracture of unspecified femur, initial encounter for closed fracture: Secondary | ICD-10-CM

## 2023-11-12 DIAGNOSIS — E870 Hyperosmolality and hypernatremia: Secondary | ICD-10-CM | POA: Diagnosis present

## 2023-11-12 DIAGNOSIS — Z881 Allergy status to other antibiotic agents status: Secondary | ICD-10-CM

## 2023-11-12 DIAGNOSIS — E785 Hyperlipidemia, unspecified: Secondary | ICD-10-CM | POA: Diagnosis present

## 2023-11-12 DIAGNOSIS — I482 Chronic atrial fibrillation, unspecified: Secondary | ICD-10-CM | POA: Diagnosis present

## 2023-11-12 DIAGNOSIS — F03918 Unspecified dementia, unspecified severity, with other behavioral disturbance: Secondary | ICD-10-CM

## 2023-11-12 DIAGNOSIS — S72002A Fracture of unspecified part of neck of left femur, initial encounter for closed fracture: Secondary | ICD-10-CM | POA: Diagnosis present

## 2023-11-12 DIAGNOSIS — F0394 Unspecified dementia, unspecified severity, with anxiety: Secondary | ICD-10-CM | POA: Diagnosis present

## 2023-11-12 DIAGNOSIS — Z789 Other specified health status: Secondary | ICD-10-CM | POA: Diagnosis not present

## 2023-11-12 DIAGNOSIS — Z888 Allergy status to other drugs, medicaments and biological substances status: Secondary | ICD-10-CM

## 2023-11-12 DIAGNOSIS — Z7189 Other specified counseling: Secondary | ICD-10-CM | POA: Diagnosis not present

## 2023-11-12 DIAGNOSIS — E876 Hypokalemia: Secondary | ICD-10-CM | POA: Diagnosis present

## 2023-11-12 DIAGNOSIS — Y92009 Unspecified place in unspecified non-institutional (private) residence as the place of occurrence of the external cause: Secondary | ICD-10-CM

## 2023-11-12 DIAGNOSIS — R569 Unspecified convulsions: Secondary | ICD-10-CM | POA: Diagnosis not present

## 2023-11-12 DIAGNOSIS — S22000A Wedge compression fracture of unspecified thoracic vertebra, initial encounter for closed fracture: Secondary | ICD-10-CM

## 2023-11-12 DIAGNOSIS — G40909 Epilepsy, unspecified, not intractable, without status epilepticus: Secondary | ICD-10-CM | POA: Diagnosis present

## 2023-11-12 DIAGNOSIS — I4891 Unspecified atrial fibrillation: Secondary | ICD-10-CM | POA: Diagnosis not present

## 2023-11-12 DIAGNOSIS — Z8673 Personal history of transient ischemic attack (TIA), and cerebral infarction without residual deficits: Secondary | ICD-10-CM

## 2023-11-12 DIAGNOSIS — Z880 Allergy status to penicillin: Secondary | ICD-10-CM

## 2023-11-12 DIAGNOSIS — R638 Other symptoms and signs concerning food and fluid intake: Secondary | ICD-10-CM | POA: Diagnosis not present

## 2023-11-12 DIAGNOSIS — E860 Dehydration: Secondary | ICD-10-CM | POA: Diagnosis present

## 2023-11-12 DIAGNOSIS — Z87891 Personal history of nicotine dependence: Secondary | ICD-10-CM | POA: Diagnosis not present

## 2023-11-12 DIAGNOSIS — S72352P Displaced comminuted fracture of shaft of left femur, subsequent encounter for closed fracture with malunion: Secondary | ICD-10-CM | POA: Diagnosis not present

## 2023-11-12 DIAGNOSIS — E663 Overweight: Secondary | ICD-10-CM | POA: Diagnosis present

## 2023-11-12 DIAGNOSIS — Z9981 Dependence on supplemental oxygen: Secondary | ICD-10-CM | POA: Diagnosis present

## 2023-11-12 DIAGNOSIS — Z711 Person with feared health complaint in whom no diagnosis is made: Secondary | ICD-10-CM | POA: Diagnosis not present

## 2023-11-12 LAB — BASIC METABOLIC PANEL
Anion gap: 7 (ref 5–15)
BUN: 21 mg/dL (ref 8–23)
CO2: 36 mmol/L — ABNORMAL HIGH (ref 22–32)
Calcium: 8.2 mg/dL — ABNORMAL LOW (ref 8.9–10.3)
Chloride: 95 mmol/L — ABNORMAL LOW (ref 98–111)
Creatinine, Ser: 1.07 mg/dL — ABNORMAL HIGH (ref 0.44–1.00)
GFR, Estimated: 51 mL/min — ABNORMAL LOW (ref >60)
Glucose, Bld: 151 mg/dL — ABNORMAL HIGH (ref 70–99)
Potassium: 4.1 mmol/L (ref 3.5–5.1)
Sodium: 138 mmol/L (ref 135–145)

## 2023-11-12 LAB — COMPREHENSIVE METABOLIC PANEL
ALT: 25 U/L (ref 0–44)
AST: 30 U/L (ref 15–41)
Albumin: 2.7 g/dL — ABNORMAL LOW (ref 3.5–5.0)
Alkaline Phosphatase: 56 U/L (ref 38–126)
Anion gap: 11 (ref 5–15)
BUN: 20 mg/dL (ref 8–23)
CO2: 36 mmol/L — ABNORMAL HIGH (ref 22–32)
Calcium: 8.4 mg/dL — ABNORMAL LOW (ref 8.9–10.3)
Chloride: 93 mmol/L — ABNORMAL LOW (ref 98–111)
Creatinine, Ser: 0.98 mg/dL (ref 0.44–1.00)
GFR, Estimated: 57 mL/min — ABNORMAL LOW (ref >60)
Glucose, Bld: 137 mg/dL — ABNORMAL HIGH (ref 70–99)
Potassium: 2.9 mmol/L — ABNORMAL LOW (ref 3.5–5.1)
Sodium: 140 mmol/L (ref 135–145)
Total Bilirubin: 0.8 mg/dL (ref 0.0–1.2)
Total Protein: 6.7 g/dL (ref 6.5–8.1)

## 2023-11-12 LAB — CBC WITH DIFFERENTIAL/PLATELET
Abs Immature Granulocytes: 0.04 10*3/uL (ref 0.00–0.07)
Basophils Absolute: 0 10*3/uL (ref 0.0–0.1)
Basophils Relative: 0 %
Eosinophils Absolute: 0.1 10*3/uL (ref 0.0–0.5)
Eosinophils Relative: 1 %
HCT: 40.4 % (ref 36.0–46.0)
Hemoglobin: 13 g/dL (ref 12.0–15.0)
Immature Granulocytes: 0 %
Lymphocytes Relative: 10 %
Lymphs Abs: 1 10*3/uL (ref 0.7–4.0)
MCH: 28.8 pg (ref 26.0–34.0)
MCHC: 32.2 g/dL (ref 30.0–36.0)
MCV: 89.4 fL (ref 80.0–100.0)
Monocytes Absolute: 0.6 10*3/uL (ref 0.1–1.0)
Monocytes Relative: 6 %
Neutro Abs: 7.9 10*3/uL — ABNORMAL HIGH (ref 1.7–7.7)
Neutrophils Relative %: 83 %
Platelets: 258 10*3/uL (ref 150–400)
RBC: 4.52 MIL/uL (ref 3.87–5.11)
RDW: 13.6 % (ref 11.5–15.5)
WBC: 9.7 10*3/uL (ref 4.0–10.5)
nRBC: 0 % (ref 0.0–0.2)

## 2023-11-12 MED ORDER — ACETAMINOPHEN 325 MG PO TABS
650.0000 mg | ORAL_TABLET | ORAL | Status: AC
  Administered 2023-11-12 – 2023-11-14 (×6): 650 mg via ORAL
  Filled 2023-11-12 (×8): qty 2

## 2023-11-12 MED ORDER — LORAZEPAM 0.5 MG PO TABS
0.2500 mg | ORAL_TABLET | Freq: Two times a day (BID) | ORAL | Status: DC
  Administered 2023-11-12 – 2023-11-21 (×16): 0.25 mg via ORAL
  Filled 2023-11-12 (×17): qty 1

## 2023-11-12 MED ORDER — LEVETIRACETAM 500 MG PO TABS
500.0000 mg | ORAL_TABLET | Freq: Two times a day (BID) | ORAL | Status: DC
  Administered 2023-11-12 – 2023-11-21 (×16): 500 mg via ORAL
  Filled 2023-11-12 (×18): qty 1

## 2023-11-12 MED ORDER — CARVEDILOL 12.5 MG PO TABS
12.5000 mg | ORAL_TABLET | Freq: Two times a day (BID) | ORAL | Status: DC
  Administered 2023-11-12 – 2023-11-14 (×4): 12.5 mg via ORAL
  Filled 2023-11-12 (×5): qty 1

## 2023-11-12 MED ORDER — MORPHINE SULFATE (CONCENTRATE) 10 MG /0.5 ML PO SOLN
5.0000 mg | ORAL | Status: DC | PRN
  Administered 2023-11-16: 5 mg via ORAL
  Filled 2023-11-12: qty 0.5

## 2023-11-12 MED ORDER — POLYETHYLENE GLYCOL 3350 17 G PO PACK
17.0000 g | PACK | Freq: Every day | ORAL | Status: DC | PRN
Start: 2023-11-12 — End: 2023-11-21

## 2023-11-12 MED ORDER — CELECOXIB 200 MG PO CAPS
200.0000 mg | ORAL_CAPSULE | Freq: Two times a day (BID) | ORAL | Status: AC
  Administered 2023-11-12 – 2023-11-14 (×4): 200 mg via ORAL
  Filled 2023-11-12 (×6): qty 1

## 2023-11-12 MED ORDER — ALBUTEROL SULFATE (2.5 MG/3ML) 0.083% IN NEBU: 2.5000 mg | INHALATION_SOLUTION | RESPIRATORY_TRACT | Status: DC | PRN

## 2023-11-12 MED ORDER — OXYCODONE HCL ER 10 MG PO T12A
10.0000 mg | EXTENDED_RELEASE_TABLET | Freq: Two times a day (BID) | ORAL | Status: AC
  Administered 2023-11-12 (×2): 10 mg via ORAL
  Filled 2023-11-12 (×2): qty 1

## 2023-11-12 MED ORDER — CHOLECALCIFEROL 10 MCG (400 UNIT) PO TABS
400.0000 [IU] | ORAL_TABLET | Freq: Every day | ORAL | Status: DC
  Administered 2023-11-12 – 2023-11-21 (×7): 400 [IU] via ORAL
  Filled 2023-11-12 (×10): qty 1

## 2023-11-12 MED ORDER — DONEPEZIL HCL 10 MG PO TABS
10.0000 mg | ORAL_TABLET | Freq: Every day | ORAL | Status: DC
  Administered 2023-11-12 – 2023-11-21 (×7): 10 mg via ORAL
  Filled 2023-11-12 (×7): qty 1
  Filled 2023-11-12: qty 2

## 2023-11-12 MED ORDER — RISPERIDONE 0.25 MG PO TABS
0.5000 mg | ORAL_TABLET | Freq: Every day | ORAL | Status: DC
  Administered 2023-11-12 – 2023-11-20 (×7): 0.5 mg via ORAL
  Filled 2023-11-12 (×7): qty 2
  Filled 2023-11-12: qty 1

## 2023-11-12 MED ORDER — POTASSIUM CHLORIDE 20 MEQ PO PACK
60.0000 meq | PACK | ORAL | Status: AC
Start: 2023-11-12 — End: 2023-11-12
  Administered 2023-11-12 (×2): 60 meq via ORAL
  Filled 2023-11-12 (×2): qty 3

## 2023-11-12 MED ORDER — SODIUM CHLORIDE 0.9% FLUSH
3.0000 mL | Freq: Two times a day (BID) | INTRAVENOUS | Status: DC
  Administered 2023-11-13 – 2023-11-21 (×17): 3 mL via INTRAVENOUS

## 2023-11-12 MED ORDER — VITAMIN B-12 1000 MCG PO TABS
1000.0000 ug | ORAL_TABLET | Freq: Every day | ORAL | Status: DC
  Administered 2023-11-12 – 2023-11-21 (×7): 1000 ug via ORAL
  Filled 2023-11-12 (×8): qty 1

## 2023-11-12 MED ORDER — FENTANYL CITRATE PF 50 MCG/ML IJ SOSY
25.0000 ug | PREFILLED_SYRINGE | Freq: Once | INTRAMUSCULAR | Status: DC
  Filled 2023-11-12 (×2): qty 1

## 2023-11-12 MED ORDER — ONDANSETRON 4 MG PO TBDP: 4.0000 mg | ORAL_TABLET | Freq: Four times a day (QID) | ORAL | Status: DC | PRN

## 2023-11-12 MED ORDER — SERTRALINE HCL 50 MG PO TABS
75.0000 mg | ORAL_TABLET | Freq: Every day | ORAL | Status: DC
  Administered 2023-11-12 – 2023-11-21 (×8): 75 mg via ORAL
  Filled 2023-11-12 (×2): qty 1
  Filled 2023-11-12: qty 2
  Filled 2023-11-12 (×6): qty 1

## 2023-11-12 MED ORDER — DIVALPROEX SODIUM 125 MG PO CSDR
125.0000 mg | DELAYED_RELEASE_CAPSULE | Freq: Two times a day (BID) | ORAL | Status: DC
  Administered 2023-11-12 – 2023-11-21 (×14): 125 mg via ORAL
  Filled 2023-11-12 (×16): qty 1

## 2023-11-12 MED ORDER — TORSEMIDE 20 MG PO TABS
80.0000 mg | ORAL_TABLET | Freq: Every day | ORAL | Status: DC
  Administered 2023-11-12: 80 mg via ORAL
  Filled 2023-11-12: qty 4

## 2023-11-12 NOTE — Assessment & Plan Note (Signed)
 Likely due to above, left side.  Closed.  S/p traction application by surgery.  Appreciate evaluation.  Pain is reasonably controlled at this time.  However given patient's dementia.  And having tolerated 25 mcg of fentanyl  reasonably well in the ER.  I will order standing acetaminophen  and Celebrex  and OxyContin  for patient comfort.

## 2023-11-12 NOTE — Assessment & Plan Note (Signed)
 This was unwitnessed.  No report of tremors loss of consciousness presyncope vomiting or diarrhea.

## 2023-11-12 NOTE — ED Provider Notes (Signed)
 [place] EMERGENCY DEPARTMENT AT [facility] Provider Note   CSN: [id number] Arrival date & time: 11/12/23  4098     History  Chief Complaint  Patient presents with   Fall   Hip Pain    Patricia Donaldson is a 83 y.o. female.   Fall  Hip Pain  83 year old female history of atrial fibrillation, hypertension, prior TIA, dementia presenting for fall.  Patient presents from a facility.  Per EMS facility stated she is at her baseline which is oriented to self.  She did have unwitnessed fall and complained of neck pain and left hip pain with some shortening to the left leg.  My examination she knows her name, she denies any headache or neck pain or chest pain or belly pain or back pain.  Endorses left hip pain.  Denies numbness or tingling.  She is not remember falling.     Home Medications Prior to Admission medications   Medication Sig Start Date End Date Taking? Authorizing Provider  albuterol  (ACCUNEB ) 0.63 MG/3ML nebulizer solution Take 1 ampule by nebulization every 4 (four) hours as needed for wheezing or shortness of breath.   Yes [provider]  carvedilol  (COREG ) 12.5 MG tablet Take 12.5 mg by mouth 2 (two) times daily.   Yes [provider]  divalproex  (DEPAKOTE  SPRINKLE) 125 MG capsule Take 125 mg by mouth 2 (two) times daily. 05/28/23  Yes [provider]  donepezil  (ARICEPT ) 10 MG tablet Take 10 mg by mouth daily.   Yes [provider]  Emollient (EUCERIN) lotion Apply 1 Application topically 2 (two) times daily.   Yes [provider]  levETIRAcetam  (KEPPRA ) 500 MG tablet Take 500 mg by mouth 2 (two) times daily.   Yes [provider]  LORazepam  (ATIVAN ) 0.5 MG tablet Take 0.25 mg by mouth every 12 (twelve) hours.   Yes [provider]  Menthol -Zinc Oxide (CALMOSEPTINE EX) Apply 1 Application topically 2 (two) times daily as needed (Rash).   Yes [provider]  morphine  (ROXANOL) 20 MG/ML  concentrated solution Take 5 mg by mouth every hour as needed for severe pain (pain score 7-10) or shortness of breath.   Yes [provider]  NONFORMULARY OR COMPOUNDED ITEM Apply 1 mL topically 2 (two) times daily as needed (Agitation). Lorazepam  2mg /mL PLO Gel   Yes [provider]  nystatin cream (MYCOSTATIN) Apply 1 Application topically 2 (two) times daily as needed for dry skin.   Yes [provider]  ondansetron  (ZOFRAN -ODT) 4 MG disintegrating tablet Take 4 mg by mouth every 6 (six) hours as needed for nausea or vomiting.   Yes [provider]  risperiDONE  (RISPERDAL ) 0.5 MG tablet Take 0.5 mg by mouth at bedtime.   Yes [provider]  rosuvastatin (CRESTOR) 10 MG tablet Take 10 mg by mouth daily.   Yes [provider]  sertraline  (ZOLOFT ) 50 MG tablet Take 75 mg by mouth daily.   Yes [provider]  torsemide  (DEMADEX ) 20 MG tablet Take 80 mg by mouth daily.   Yes [provider]  vitamin B-12 (CYANOCOBALAMIN ) 1000 MCG tablet Take 1,000 mcg by mouth daily.   Yes [provider]  Vitamin D , Cholecalciferol , 10 MCG (400 UNIT) CHEW Chew 400 Units by mouth daily.   Yes [provider]      Allergies    Erythromycin, Aspirin, Other, Penicillins, and Lobster [shellfish allergy]    Review of Systems   Review of Systems Review of  systems completed and notable as per HPI.  ROS otherwise negative.   Physical Exam Updated Vital Signs BP 134/79   Pulse 78   Temp (!) 97.4 F (36.3 C)   Resp 18   Ht 5\' 4"  (1.626 m)   Wt 68.9 kg   SpO2 96%   BMI 26.09 kg/m  Physical Exam Vitals and nursing note reviewed.  Constitutional:      General: She is not in acute distress.    Appearance: She is well-developed.  HENT:     Head: Normocephalic and atraumatic.     Nose: Nose normal.     Mouth/Throat:     Mouth: Mucous membranes are moist.     Pharynx: Oropharynx is clear.  Eyes:     Extraocular  Movements: Extraocular movements intact.     Conjunctiva/sclera: Conjunctivae normal.     Pupils: Pupils are equal, round, and reactive to light.  Neck:     Comments: In cervical collar Cardiovascular:     Rate and Rhythm: Normal rate and regular rhythm.     Pulses: Normal pulses.     Heart sounds: Normal heart sounds. No murmur heard. Pulmonary:     Effort: Pulmonary effort is normal. No respiratory distress.     Breath sounds: Normal breath sounds.  Abdominal:     Palpations: Abdomen is soft.     Tenderness: There is no abdominal tenderness.  Musculoskeletal:        General: No swelling.     Cervical back: Neck supple. No tenderness.     Right lower leg: No edema.     Left lower leg: No edema.     Comments: Left leg is shortened.  Palpable DP and PT pulse.  Strength and sensation are intact.  Tenderness and limited range of motion of left hip.  Skin:    General: Skin is warm and dry.     Capillary Refill: Capillary refill takes less than 2 seconds.  Neurological:     General: No focal deficit present.     Mental Status: She is alert. Mental status is at baseline.     Comments: Oriented to self.  Disoriented to year, place.  Does not member falling.  She is no cranial nerve deficits.  No visual field cuts.  Extraocular movements are intact.  Able to hold extremities antigravity without drift.  Sensations intact.  Normal speech.  No neglect.  Psychiatric:        Mood and Affect: Mood normal.     ED Results / Procedures / Treatments   Labs (all labs ordered are listed, but only abnormal results are displayed) Labs Reviewed  CBC WITH DIFFERENTIAL/PLATELET - Abnormal; Notable for the following components:      Result Value   Neutro Abs 7.9 (*)    All other components within normal limits  COMPREHENSIVE METABOLIC PANEL - Abnormal; Notable for the following components:   Potassium 2.9 (*)    Chloride 93 (*)    CO2 36 (*)    Glucose, Bld 137 (*)    Calcium 8.4 (*)    Albumin  2.7 (*)    GFR, Estimated 57 (*)    All other components within normal limits  SURGICAL PCR SCREEN  BASIC METABOLIC PANEL    EKG EKG Interpretation Date/Time:  Wednesday November 12 2023 10:01:27 EST Ventricular Rate:  72 PR Interval:    QRS Duration:  113 QT Interval:  527 QTC Calculation: 577 R Axis:   84  Text Interpretation:  Atrial fibrillation Borderline intraventricular conduction delay Nonspecific repol abnormality, diffuse leads Prolonged QT interval Confirmed by Odie Edmonds [phone]) on 11/12/2023 10:50:15 AM  Radiology DG Pelvis Portable Result Date: 11/12/2023 CLINICAL DATA:  Fall EXAM: PORTABLE PELVIS 1-2 VIEWS; LEFT FEMUR PORTABLE 2 VIEWS COMPARISON:  None Available. FINDINGS: Acute, comminuted intertrochanteric fracture of the proximal left femur with approximately 90 degrees of varus angulation. Hip joint alignment is maintained without dislocation. Bony pelvis intact without evidence of fracture or diastasis. Knee joint alignment maintained with medial compartment joint space narrowing. IMPRESSION: Acute, comminuted intertrochanteric fracture of the proximal left femur with varus angulation. Electronically Signed   By: Leverne Reading D.O.   On: 11/12/2023 11:05   DG FEMUR PORT MIN 2 VIEWS LEFT Result Date: 11/12/2023 CLINICAL DATA:  Fall EXAM: PORTABLE PELVIS 1-2 VIEWS; LEFT FEMUR PORTABLE 2 VIEWS COMPARISON:  None Available. FINDINGS: Acute, comminuted intertrochanteric fracture of the proximal left femur with approximately 90 degrees of varus angulation. Hip joint alignment is maintained without dislocation. Bony pelvis intact without evidence of fracture or diastasis. Knee joint alignment maintained with medial compartment joint space narrowing. IMPRESSION: Acute, comminuted intertrochanteric fracture of the proximal left femur with varus angulation. Electronically Signed   By: Leverne Reading D.O.   On: 11/12/2023 11:05   DG Chest Port 1 View Result Date:  11/12/2023 CLINICAL DATA:  Fall EXAM: PORTABLE CHEST 1 VIEW COMPARISON:  10/19/2022 FINDINGS: Cardiomegaly. Aortic atherosclerosis. No focal airspace consolidation, pleural effusion, or pneumothorax. IMPRESSION: No active disease. Electronically Signed   By: Leverne Reading D.O.   On: 11/12/2023 11:04   CT Head Wo Contrast Result Date: 11/12/2023 CLINICAL DATA:  Head trauma, minor (Age >= 65y); Neck trauma (Age >= 65y) EXAM: CT HEAD WITHOUT CONTRAST CT CERVICAL SPINE WITHOUT CONTRAST TECHNIQUE: Multidetector CT imaging of the head and cervical spine was performed following the standard protocol without intravenous contrast. Multiplanar CT image reconstructions of the cervical spine were also generated. RADIATION DOSE REDUCTION: This exam was performed according to the departmental dose-optimization program which includes automated exposure control, adjustment of the mA and/or kV according to patient size and/or use of iterative reconstruction technique. COMPARISON:  None Available. FINDINGS: CT HEAD FINDINGS Brain: No hemorrhage. No hydrocephalus. No extra-axial fluid collection. No mass effect. No mass lesion. No CT evidence of an acute cortical infarct. 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 Vascular: No hyperdense vessel or unexpected calcification. Skull: Normal. Negative for fracture or focal lesion. Sinuses/Orbits: No middle ear or mastoid effusion. Paranasal sinuses are notable for frothy secretions in the left sphenoid sinus. Bilateral lens replacement. Orbits are otherwise unremarkable. Other: None. CT CERVICAL SPINE FINDINGS Alignment: Normal. Skull base and vertebrae: No acute fracture. No primary bone lesion or focal pathologic process. Soft tissues and spinal canal: No prevertebral fluid or swelling. No visible canal hematoma. Disc levels:  No CT evidence of high-grade spinal canal stenosis. Upper chest: Negative. Other:  There is an age indeterminate minimal superior endplate compression deformity at T2. Correlate with point tenderness. IMPRESSION: 1. No CT evidence of intracranial injury. 2. No acute fracture or traumatic subluxation of the cervical spine. 3. Age indeterminate minimal superior endplate compression deformity at T2. Correlate with point tenderness. Electronically Signed   By: Clora Dane M.D.   On: 11/12/2023 10:12   CT Cervical Spine Wo Contrast Result Date: 11/12/2023 CLINICAL DATA:  Head trauma, minor (Age >= 65y); Neck trauma (Age >= 65y)  EXAM: CT HEAD WITHOUT CONTRAST CT CERVICAL SPINE WITHOUT CONTRAST TECHNIQUE: Multidetector CT imaging of the head and cervical spine was performed following the standard protocol without intravenous contrast. Multiplanar CT image reconstructions of the cervical spine were also generated. RADIATION DOSE REDUCTION: This exam was performed according to the departmental dose-optimization program which includes automated exposure control, adjustment of the mA and/or kV according to patient size and/or use of iterative reconstruction technique. COMPARISON:  None Available. FINDINGS: CT HEAD FINDINGS Brain: No hemorrhage. No hydrocephalus. No extra-axial fluid collection. No mass effect. No mass lesion. No CT evidence of an acute cortical infarct. 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 Vascular: No hyperdense vessel or unexpected calcification. Skull: Normal. Negative for fracture or focal lesion. Sinuses/Orbits: No middle ear or mastoid effusion. Paranasal sinuses are notable for frothy secretions in the left sphenoid sinus. Bilateral lens replacement. Orbits are otherwise unremarkable. Other: None. CT CERVICAL SPINE FINDINGS Alignment: Normal. Skull base and vertebrae: No acute fracture. No primary bone lesion or focal pathologic process. Soft tissues and spinal canal: No prevertebral fluid or swelling.  No visible canal hematoma. Disc levels:  No CT evidence of high-grade spinal canal stenosis. Upper chest: Negative. Other: There is an age indeterminate minimal superior endplate compression deformity at T2. Correlate with point tenderness. IMPRESSION: 1. No CT evidence of intracranial injury. 2. No acute fracture or traumatic subluxation of the cervical spine. 3. Age indeterminate minimal superior endplate compression deformity at T2. Correlate with point tenderness. Electronically Signed   By: Clora Dane M.D.   On: 11/12/2023 10:12    Procedures Procedures    Medications Ordered in ED Medications  fentaNYL  (SUBLIMAZE ) injection 25 mcg (0 mcg Intravenous Hold 11/12/23 1217)  acetaminophen  (TYLENOL ) tablet 650 mg (650 mg Oral Given 11/12/23 1341)  celecoxib  (CELEBREX ) capsule 200 mg (200 mg Oral Given 11/12/23 1341)  oxyCODONE  (OXYCONTIN ) 12 hr tablet 10 mg (10 mg Oral Given 11/12/23 1340)  potassium chloride  (KLOR-CON ) packet 60 mEq (60 mEq Oral Given 11/12/23 1402)  morphine  (ROXANOL) 20 MG/ML concentrated solution 5 mg (has no administration in time range)  LORazepam  (ATIVAN ) tablet 0.25 mg (0.25 mg Oral Given 11/12/23 1340)  albuterol  (PROVENTIL ) (2.5 MG/3ML) 0.083% nebulizer solution 2.5 mg (has no administration in time range)  divalproex  (DEPAKOTE  SPRINKLE) capsule 125 mg (125 mg Oral Given 11/12/23 1340)  levETIRAcetam  (KEPPRA ) tablet 500 mg (500 mg Oral Given 11/12/23 1341)  sertraline  (ZOLOFT ) tablet 75 mg (75 mg Oral Given 11/12/23 1340)  ondansetron  (ZOFRAN -ODT) disintegrating tablet 4 mg (has no administration in time range)  risperiDONE  (RISPERDAL ) tablet 0.5 mg (has no administration in time range)  carvedilol  (COREG ) tablet 12.5 mg (12.5 mg Oral Given 11/12/23 1340)  torsemide  (DEMADEX ) tablet 80 mg (80 mg Oral Given 11/12/23 1340)  cyanocobalamin  (VITAMIN B12) tablet 1,000 mcg (1,000 mcg Oral Given 11/12/23 1340)  donepezil  (ARICEPT ) tablet 10 mg (10 mg Oral Given 11/12/23 1340)   cholecalciferol  (VITAMIN D3) 10 MCG (400 UNIT) tablet 400 Units (400 Units Oral Given 11/12/23 1559)  polyethylene glycol (MIRALAX  / GLYCOLAX ) packet 17 g (has no administration in time range)  sodium chloride  flush (NS) 0.9 % injection 3 mL (3 mLs Intravenous Not Given 11/12/23 1552)    ED Course/ Medical Decision Making/ A&P  Medical Decision Making Amount and/or Complexity of Data Reviewed Labs: ordered. Radiology: ordered.  Risk Decision regarding hospitalization.   Medical Decision Making:   Patricia Donaldson is a 83 y.o. female who presented to the ED today with reported unwitnessed fall.  Vital signs reviewed.  On exam she is disoriented which per EMS and notes that can rate seems to be her baseline.  I complained of neck pain although denies it to me.  She is in a collar obtain CT head and cervical spine.  Also obtain lab workup.  She does have shortening of the left hip concerning for fracture or dislocation.   Additional history discussed with patient's family/caregivers.  Patient placed on continuous vitals and telemetry monitoring while in ED which was reviewed periodically.  Reviewed and confirmed nursing documentation for past medical history, family history, social history.  Reassessment and Plan:   Patient with acute fracture of her left hip.  No other acute injuries on imaging.  Discussed with patient's son over the phone.  Patient is currently under hospice care at her facility for dementia.  Patient would have to leave hospice care to undergo surgery for her hip and family is unsure if they want this or not.  Will plan for admission for pain control, and I paged palliative care to see if they can help family decide regarding possible surgery.  No response from palliative care yet.  I did notify orthopedic surgery Steffanie Edouard of patient's status under hospice care.  They talked with the son, patient is still under comfort care with  family is okay with surgery for palliation.  I spoke with the hospitalist, regarding patient's injury and plan including need for likely palliative care consultation to help with planning.  Admitted for further management.   Patient's presentation is most consistent with acute complicated illness / injury requiring diagnostic workup.           Final Clinical Impression(s) / ED Diagnoses Final diagnoses:  Closed fracture of left hip, initial encounter [facility])    Rx / DC Orders ED Discharge Orders     None         Coleman Daughters, MD 11/12/23 [phone]

## 2023-11-12 NOTE — Assessment & Plan Note (Signed)
 See CAT scan finding of age-indeterminate minimal superior endplate compression deformity at T2.  I could not elicit any tenderness at this time.  I suspect this may be chronic.

## 2023-11-12 NOTE — ED Notes (Signed)
 Per previous RN, patient removed PIV. Per Dr. Bennie Brave, patient does not require PIV at this time.

## 2023-11-12 NOTE — H&P (Signed)
 History and Physical    Patient: Patricia Donaldson WUJ:[id number] DOB: 1941/04/21 DOA: 11/12/2023 DOS: the patient was seen and examined on 11/12/2023 PCP: Janann Meadow, NP  Patient coming from: SNF  Chief Complaint:  Chief Complaint  Patient presents with   Fall   Hip Pain   HPI: Patricia Donaldson is a 83 y.o. female with medical history significant of dementia.  Patient is in a memory care unit of nursing home.  Patient, per report, had an unwitnessed fall.  With inability to get back up.  Patient is brought to [facility], ER.  Workup reveals finding of left femoral fracture.  Patient is unable to provide any good history.  But offers no complaints at this time including no report of pain.  There is no other secondary complaints from the transferring facility such as fever vomiting diarrhea etc.  I discussed patient's care over the phone with Son Harjit Burkart.  He advised clearly that the patient should receive only palliative/comfort measures.  Patient would not want any treatment with curative intent unless the comfort factor is overwhelming.  Review of Systems: As mentioned in the history of present illness. All other systems reviewed and are negative. 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 She has never used smokeless tobacco. She reports that she does not drink alcohol. No history on file for drug use.  Allergies  Allergen Reactions   Erythromycin Hives and Rash   Aspirin    Other Other (See Comments)    Medication which  has discolored lower extremity/feet.   Penicillins Hives   Lobster [Shellfish Allergy] Hives and Rash    Family History  Problem Relation Age of Onset   Cancer Mother        stomach   Breast cancer Maternal Grandmother    Kidney disease Neg Hx    Bladder Cancer Neg Hx     Prior to Admission medications   Medication Sig Start Date End Date Taking? Authorizing Provider  albuterol  (ACCUNEB ) 0.63 MG/3ML nebulizer solution Take 1 ampule by nebulization every 4 (four) hours as needed for wheezing or shortness of breath.   Yes [provider]  carvedilol  (COREG ) 12.5 MG tablet Take 12.5 mg by mouth 2 (two) times daily.   Yes [provider]  divalproex  (DEPAKOTE  SPRINKLE) 125 MG capsule Take 125 mg by mouth 2 (two) times daily. 05/28/23  Yes [provider]  donepezil  (ARICEPT ) 10 MG tablet Take 10 mg by mouth daily.   Yes [provider]  Emollient (EUCERIN) lotion Apply 1 Application topically 2 (two) times daily.   Yes [provider]  levETIRAcetam  (KEPPRA ) 500 MG tablet Take 500 mg by mouth 2 (two) times daily.   Yes [provider]  LORazepam  (ATIVAN ) 0.5 MG tablet Take 0.25 mg by mouth every 12 (twelve) hours.   Yes [provider]  Menthol -Zinc Oxide (CALMOSEPTINE EX) Apply 1 Application topically 2 (two) times daily as needed (Rash).   Yes [provider]  morphine  (ROXANOL) 20 MG/ML concentrated solution Take 5 mg by mouth every hour as needed for severe pain (pain score 7-10) or shortness of breath.   Yes [provider]  NONFORMULARY OR COMPOUNDED ITEM Apply 1 mL topically 2 (two) times daily as needed (Agitation). Lorazepam  2mg /mL PLO Gel   Yes [provider]  nystatin cream (MYCOSTATIN) Apply 1 Application topically 2 (two) times daily as needed for dry skin.   Yes [provider]  ondansetron  (ZOFRAN -ODT) 4 MG disintegrating tablet Take 4 mg by mouth every 6 (six) hours as needed for  nausea or vomiting.   Yes [provider]  risperiDONE  (RISPERDAL ) 0.5 MG tablet Take 0.5 mg by mouth at bedtime.   Yes [provider]  rosuvastatin (CRESTOR) 10 MG tablet Take 10 mg by mouth daily.   Yes [provider]  sertraline  (ZOLOFT ) 50 MG tablet Take 75 mg by mouth daily.   Yes [provider]  torsemide  (DEMADEX ) 20 MG tablet Take 80 mg by mouth daily.   Yes [provider]  vitamin B-12 (CYANOCOBALAMIN ) 1000 MCG tablet Take 1,000 mcg by mouth daily.   Yes [provider]  Vitamin D , Cholecalciferol , 10 MCG (400 UNIT) CHEW Chew 400 Units by mouth daily.   Yes [provider]    Physical Exam: Vitals:   11/12/23 1145 11/12/23 1200 11/12/23 1210 11/12/23 1217  BP:  137/73    Pulse:    84  Resp:   15 19  Temp:      TempSrc:      SpO2: 94%   94%  Weight:      Height:       General: Patient is alert and awake, reasonably pleasantly interactive.  Does not appear to be distressed. Respiratory exam: Bilateral intravesicular Cardiovascular exam S1-S2 normal Abdomen all quadrants are soft nontender Extremities warm without edema left lower extremity is in traction with good distal function at ankle and toes.  Pelvis is stable, other extremities demonstrate no clinical fracture neck movements are free no cranial trauma is noted.  Chest wall no tenderness. Back exam is limited as patient is mostly recumbent but no clinical fracture is identified with limited palpation. Data Reviewed:  Labs on Admission:  Results for orders placed or performed during the hospital encounter of 11/12/23 (from the past 24 hours)  CBC with Differential     Status: Abnormal   Collection Time: 11/12/23  9:18 AM  Result Value Ref Range   WBC 9.7 4.0 - 10.5 K/uL   RBC 4.52 3.87 - 5.11 MIL/uL   Hemoglobin 13.0 12.0 - 15.0 g/dL   HCT 16.1 09.6 - 04.5 %   MCV 89.4 80.0 - 100.0 fL   MCH 28.8 26.0 - 34.0 pg   MCHC 32.2 30.0 - 36.0 g/dL   RDW  40.9 81.1 - 91.4 %   Platelets 258 150 - 400 K/uL   nRBC 0.0 0.0 - 0.2 %   Neutrophils Relative % 83 %   Neutro Abs 7.9 (H) 1.7 - 7.7 K/uL   Lymphocytes Relative 10 %   Lymphs Abs 1.0 0.7 - 4.0 K/uL   Monocytes Relative 6 %   Monocytes Absolute 0.6 0.1 - 1.0 K/uL   Eosinophils Relative 1 %   Eosinophils Absolute 0.1 0.0 - 0.5 K/uL   Basophils Relative 0 %  Basophils Absolute 0.0 0.0 - 0.1 K/uL   Immature Granulocytes 0 %   Abs Immature Granulocytes 0.04 0.00 - 0.07 K/uL  Comprehensive metabolic panel     Status: Abnormal   Collection Time: 11/12/23 10:10 AM  Result Value Ref Range   Sodium 140 135 - 145 mmol/L   Potassium 2.9 (L) 3.5 - 5.1 mmol/L   Chloride 93 (L) 98 - 111 mmol/L   CO2 36 (H) 22 - 32 mmol/L   Glucose, Bld 137 (H) 70 - 99 mg/dL   BUN 20 8 - 23 mg/dL   Creatinine, Ser 1.61 0.44 - 1.00 mg/dL   Calcium 8.4 (L) 8.9 - 10.3 mg/dL   Total Protein 6.7 6.5 - 8.1 g/dL   Albumin 2.7 (L) 3.5 - 5.0 g/dL   AST 30 15 - 41 U/L   ALT 25 0 - 44 U/L   Alkaline Phosphatase 56 38 - 126 U/L   Total Bilirubin 0.8 0.0 - 1.2 mg/dL   GFR, Estimated 57 (L) >60 mL/min   Anion gap 11 5 - 15   Basic Metabolic Panel: Recent Labs  Lab 11/12/23 1010  NA 140  K 2.9*  CL 93*  CO2 36*  GLUCOSE 137*  BUN 20  CREATININE 0.98  CALCIUM 8.4*   Liver Function Tests: Recent Labs  Lab 11/12/23 1010  AST 30  ALT 25  ALKPHOS 56  BILITOT 0.8  PROT 6.7  ALBUMIN 2.7*   No results for input(s): "LIPASE", "AMYLASE" in the last 168 hours. No results for input(s): "AMMONIA" in the last 168 hours. CBC: Recent Labs  Lab 11/12/23 0918  WBC 9.7  NEUTROABS 7.9*  HGB 13.0  HCT 40.4  MCV 89.4  PLT 258   Cardiac Enzymes: No results for input(s): "CKTOTAL", "CKMB", "CKMBINDEX", "TROPONINIHS" in the last 168 hours.  BNP (last 3 results) No results for input(s): "PROBNP" in the last 8760 hours. CBG: No results for input(s): "GLUCAP" in the last 168 hours.  Radiological Exams on  Admission:  DG Pelvis Portable Result Date: 11/12/2023 CLINICAL DATA:  Fall EXAM: PORTABLE PELVIS 1-2 VIEWS; LEFT FEMUR PORTABLE 2 VIEWS COMPARISON:  None Available. FINDINGS: Acute, comminuted intertrochanteric fracture of the proximal left femur with approximately 90 degrees of varus angulation. Hip joint alignment is maintained without dislocation. Bony pelvis intact without evidence of fracture or diastasis. Knee joint alignment maintained with medial compartment joint space narrowing. IMPRESSION: Acute, comminuted intertrochanteric fracture of the proximal left femur with varus angulation. Electronically Signed   By: Leverne Reading D.O.   On: 11/12/2023 11:05   DG FEMUR PORT MIN 2 VIEWS LEFT Result Date: 11/12/2023 CLINICAL DATA:  Fall EXAM: PORTABLE PELVIS 1-2 VIEWS; LEFT FEMUR PORTABLE 2 VIEWS COMPARISON:  None Available. FINDINGS: Acute, comminuted intertrochanteric fracture of the proximal left femur with approximately 90 degrees of varus angulation. Hip joint alignment is maintained without dislocation. Bony pelvis intact without evidence of fracture or diastasis. Knee joint alignment maintained with medial compartment joint space narrowing. IMPRESSION: Acute, comminuted intertrochanteric fracture of the proximal left femur with varus angulation. Electronically Signed   By: Leverne Reading D.O.   On: 11/12/2023 11:05   DG Chest Port 1 View Result Date: 11/12/2023 CLINICAL DATA:  Fall EXAM: PORTABLE CHEST 1 VIEW COMPARISON:  10/19/2022 FINDINGS: Cardiomegaly. Aortic atherosclerosis. No focal airspace consolidation, pleural effusion, or pneumothorax. IMPRESSION: No active disease. Electronically Signed   By: Leverne Reading D.O.   On: 11/12/2023 11:04   CT Head Wo Contrast Result Date:  11/12/2023 CLINICAL DATA:  Head trauma, minor (Age >= 65y); Neck trauma (Age >= 65y) EXAM: CT HEAD WITHOUT CONTRAST CT CERVICAL SPINE WITHOUT CONTRAST TECHNIQUE: Multidetector CT imaging of the head and  cervical spine was performed following the standard protocol without intravenous contrast. Multiplanar CT image reconstructions of the cervical spine were also generated. RADIATION DOSE REDUCTION: This exam was performed according to the departmental dose-optimization program which includes automated exposure control, adjustment of the mA and/or kV according to patient size and/or use of iterative reconstruction technique. COMPARISON:  None Available. FINDINGS: CT HEAD FINDINGS Brain: No hemorrhage. No hydrocephalus. No extra-axial fluid collection. No mass effect. No mass lesion. No CT evidence of an acute cortical infarct. 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 Vascular: No hyperdense vessel or unexpected calcification. Skull: Normal. Negative for fracture or focal lesion. Sinuses/Orbits: No middle ear or mastoid effusion. Paranasal sinuses are notable for frothy secretions in the left sphenoid sinus. Bilateral lens replacement. Orbits are otherwise unremarkable. Other: None. CT CERVICAL SPINE FINDINGS Alignment: Normal. Skull base and vertebrae: No acute fracture. No primary bone lesion or focal pathologic process. Soft tissues and spinal canal: No prevertebral fluid or swelling. No visible canal hematoma. Disc levels:  No CT evidence of high-grade spinal canal stenosis. Upper chest: Negative. Other: There is an age indeterminate minimal superior endplate compression deformity at T2. Correlate with point tenderness. IMPRESSION: 1. No CT evidence of intracranial injury. 2. No acute fracture or traumatic subluxation of the cervical spine. 3. Age indeterminate minimal superior endplate compression deformity at T2. Correlate with point tenderness. Electronically Signed   By: Clora Dane M.D.   On: 11/12/2023 10:12   CT Cervical Spine Wo Contrast Result Date: 11/12/2023 CLINICAL DATA:  Head trauma, minor (Age >= 65y); Neck trauma (Age >=  65y) EXAM: CT HEAD WITHOUT CONTRAST CT CERVICAL SPINE WITHOUT CONTRAST TECHNIQUE: Multidetector CT imaging of the head and cervical spine was performed following the standard protocol without intravenous contrast. Multiplanar CT image reconstructions of the cervical spine were also generated. RADIATION DOSE REDUCTION: This exam was performed according to the departmental dose-optimization program which includes automated exposure control, adjustment of the mA and/or kV according to patient size and/or use of iterative reconstruction technique. COMPARISON:  None Available. FINDINGS: CT HEAD FINDINGS Brain: No hemorrhage. No hydrocephalus. No extra-axial fluid collection. No mass effect. No mass lesion. No CT evidence of an acute cortical infarct. 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 Vascular: No hyperdense vessel or unexpected calcification. Skull: Normal. Negative for fracture or focal lesion. Sinuses/Orbits: No middle ear or mastoid effusion. Paranasal sinuses are notable for frothy secretions in the left sphenoid sinus. Bilateral lens replacement. Orbits are otherwise unremarkable. Other: None. CT CERVICAL SPINE FINDINGS Alignment: Normal. Skull base and vertebrae: No acute fracture. No primary bone lesion or focal pathologic process. Soft tissues and spinal canal: No prevertebral fluid or swelling. No visible canal hematoma. Disc levels:  No CT evidence of high-grade spinal canal stenosis. Upper chest: Negative. Other: There is an age indeterminate minimal superior endplate compression deformity at T2. Correlate with point tenderness. IMPRESSION: 1. No CT evidence of intracranial injury. 2. No acute fracture or traumatic subluxation of the cervical spine. 3. Age indeterminate minimal superior endplate compression deformity at T2. Correlate with point tenderness. Electronically Signed   By: Clora Dane M.D.   On: 11/12/2023  10:12        Assessment and Plan: * Fall at home, initial encounter This was unwitnessed.  No report of tremors loss of consciousness presyncope vomiting or diarrhea.  Femoral fracture (HCC) Likely due to above, left side.  Closed.  S/p traction application by surgery.  Appreciate evaluation.  Pain is reasonably controlled at this time.  However given patient's dementia.  And having tolerated 25 mcg of fentanyl  reasonably well in the ER.  I will order standing acetaminophen  and Celebrex  and OxyContin  for patient comfort.  Seizure (HCC) I am not sure if this is a firm diagnosis in this patient. Not acute.  Continue with Depakote  Sprinkle as well as Keppra .  Compression fracture of body of thoracic vertebra (HCC) See CAT scan finding of age-indeterminate minimal superior endplate compression deformity at T2.  I could not elicit any tenderness at this time.  I suspect this may be chronic.   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 Overarching goal of care is comfort only.  C/w with morphine  5 mg every 1 hours as needed for severe pain or shortness of breath.  Continue with 0.25 mg of Ativan  every 12 hours.  Continue with sertraline  75 daily.  Continue with Zofran  as needed.  Hold off on rosuvastatin 10 mg daily.  Continue with Risperdal  0.5 mg at bedtime.  Continue with chronic Coreg  12.52 times a day.  Continue with chronic 80 mg of torsemide  daily .  Continue with 10 mg of donepezil  daily    Advance Care Planning:   Code Status: Not on file son advised that patient wishes to be DNR DNI . Corroborated with patient.  Consults: orthopedic evaluation is appreciated.  Family Communication: discussed with Rebecca Campus over phone in  detail  Severity of Illness: The appropriate patient status for this patient is INPATIENT. Inpatient status is judged to be reasonable and necessary in order to provide the required intensity of service to ensure the patient's safety. The patient's presenting symptoms, physical exam findings, and initial radiographic and laboratory data in the context of their chronic comorbidities is felt to place them at high risk for further clinical deterioration. Furthermore, it is not anticipated that the patient will be medically stable for discharge from the hospital within 2 midnights of admission.   * I certify that at the point of admission it is my clinical judgment that the patient will require inpatient hospital care spanning beyond 2 midnights from the point of admission due to high intensity of service, high risk for further deterioration and high frequency of surveillance required.*  Author: Bennie Brave, MD 11/12/2023 12:51 PM  For on call review [email or web address].

## 2023-11-12 NOTE — Consult Note (Signed)
Reason for Consult:Left hip fx Referring Physician: Fulton Reek Time called: [id number] Time at bedside: 0947   Patricia Donaldson is an 83 y.o. female.  HPI: Patricia Donaldson fell at the SNF where she resides. She was brought to the ED c/o neck and hip pain. Workup showed a left hip fx and orthopedic surgery was consulted. She is demented and cannot contribute to history.  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 She has never used smokeless tobacco. She reports that she does not drink alcohol. No history on file for drug use.  Allergies:  Allergies  Allergen Reactions   Erythromycin Hives and Rash   Aspirin    Other Other (See Comments)    Medication which has discolored lower extremity/feet.   Penicillins Hives   Lobster [Shellfish Allergy] Hives and Rash    Medications: I have reviewed the patient's current medications.  No results found for this or any previous visit (from the past 48 hours).  No results found.  Review of Systems  Unable to perform ROS: Dementia   Blood pressure (!) 133/99, pulse 74, temperature (!) 97.5 F (36.4 C), temperature source Oral, resp. rate 16, height 5\' 4"  (1.626 m), weight 68.9 kg, SpO2 97%. Physical Exam Constitutional:      General: She is not in acute distress.    Appearance: She is well-developed. She is not  diaphoretic.  HENT:     Head: Normocephalic and atraumatic.  Eyes:     General: No scleral icterus.       Right eye: No discharge.        Left eye: No discharge.     Conjunctiva/sclera: Conjunctivae normal.  Cardiovascular:     Rate and Rhythm: Normal rate and regular rhythm.  Pulmonary:     Effort: Pulmonary effort is normal. No respiratory distress.  Musculoskeletal:     Cervical back: Normal range of motion.     Comments: LLE No traumatic wounds, ecchymosis, or rash  Mod TTP hip  No knee or ankle effusion  Knee stable to varus/ valgus and anterior/posterior stress  Sens DPN, SPN, TN could not assess  Motor EHL, ext, flex, evers grossly intact  DP 2+, PT 2+, No significant edema  Skin:    General: Skin is warm and dry.  Neurological:     Mental Status: She is alert.  Psychiatric:        Mood and Affect: Mood normal.        Behavior: Behavior normal.     Assessment/Plan: Left hip fx -- Plan IMN tomorrow with Dr. Carola Frost. Please keep NPO after  MN. Multiple medical problems including dementia, afib not on anticoagulation, HTN, and HLD -- per primary service    Freeman Caldron, PA-C Orthopedic Surgery [phone] 11/12/2023, 9:52 AM

## 2023-11-12 NOTE — Assessment & Plan Note (Addendum)
 I am not sure if this is a firm diagnosis in this patient. Not acute.  Continue with Depakote  Sprinkle as well as Keppra .

## 2023-11-12 NOTE — ED Triage Notes (Signed)
 Pt bib ems from [facility] after unwitnessed fall. Pt complains of neck pain and hip pain with shortening noted to LLE. Pt with hx dementia, arrives with ccollar in place. VSS with ems.

## 2023-11-12 NOTE — ED Notes (Signed)
 Placed in hospital bed for bucks traction]

## 2023-11-12 NOTE — Progress Notes (Signed)
 Orthopedic Tech Progress Note Patient Details:  SOLANGIE GANLEY 08/03/1941 [id number]  Musculoskeletal Traction Type of Traction: Bucks Skin Traction Traction Location: LLE Traction Weight: 5 lbs   Post Interventions Patient Tolerated: Well Instructions Provided: Care of device  Kermitt Pedlar 11/12/2023, 11:29 AM

## 2023-11-13 ENCOUNTER — Encounter (HOSPITAL_COMMUNITY): Payer: Self-pay | Admitting: Internal Medicine

## 2023-11-13 ENCOUNTER — Inpatient Hospital Stay (HOSPITAL_COMMUNITY): Payer: Medicare Other

## 2023-11-13 ENCOUNTER — Encounter (HOSPITAL_COMMUNITY)
Admission: EM | Disposition: A | Discharge status: Skilled Nursing Facility | Payer: Self-pay | Source: Skilled Nursing Facility | Attending: Internal Medicine

## 2023-11-13 ENCOUNTER — Inpatient Hospital Stay (HOSPITAL_COMMUNITY): Payer: Medicare Other | Admitting: Anesthesiology

## 2023-11-13 ENCOUNTER — Other Ambulatory Visit: Payer: Self-pay

## 2023-11-13 DIAGNOSIS — I4891 Unspecified atrial fibrillation: Secondary | ICD-10-CM

## 2023-11-13 DIAGNOSIS — E785 Hyperlipidemia, unspecified: Secondary | ICD-10-CM

## 2023-11-13 DIAGNOSIS — S72142A Displaced intertrochanteric fracture of left femur, initial encounter for closed fracture: Secondary | ICD-10-CM | POA: Diagnosis not present

## 2023-11-13 DIAGNOSIS — J45909 Unspecified asthma, uncomplicated: Secondary | ICD-10-CM | POA: Diagnosis not present

## 2023-11-13 DIAGNOSIS — I10 Essential (primary) hypertension: Secondary | ICD-10-CM | POA: Diagnosis not present

## 2023-11-13 DIAGNOSIS — R569 Unspecified convulsions: Secondary | ICD-10-CM | POA: Diagnosis not present

## 2023-11-13 DIAGNOSIS — S72352P Displaced comminuted fracture of shaft of left femur, subsequent encounter for closed fracture with malunion: Secondary | ICD-10-CM | POA: Diagnosis not present

## 2023-11-13 DIAGNOSIS — F419 Anxiety disorder, unspecified: Secondary | ICD-10-CM

## 2023-11-13 DIAGNOSIS — F03918 Unspecified dementia, unspecified severity, with other behavioral disturbance: Secondary | ICD-10-CM

## 2023-11-13 DIAGNOSIS — W19XXXA Unspecified fall, initial encounter: Secondary | ICD-10-CM | POA: Diagnosis not present

## 2023-11-13 HISTORY — PX: INTRAMEDULLARY (IM) NAIL INTERTROCHANTERIC: SHX5875

## 2023-11-13 LAB — GLUCOSE, CAPILLARY
Glucose-Capillary: 144 mg/dL — ABNORMAL HIGH (ref 70–99)
Glucose-Capillary: 136 mg/dL — ABNORMAL HIGH (ref 70–99)

## 2023-11-13 LAB — CBC
HCT: 36.1 % (ref 36.0–46.0)
Hemoglobin: 11.3 g/dL — ABNORMAL LOW (ref 12.0–15.0)
MCH: 29 pg (ref 26.0–34.0)
MCHC: 31.3 g/dL (ref 30.0–36.0)
MCV: 92.6 fL (ref 80.0–100.0)
Platelets: 243 10*3/uL (ref 150–400)
RBC: 3.9 MIL/uL (ref 3.87–5.11)
RDW: 13.6 % (ref 11.5–15.5)
WBC: 14.9 10*3/uL — ABNORMAL HIGH (ref 4.0–10.5)
nRBC: 0 % (ref 0.0–0.2)

## 2023-11-13 LAB — BASIC METABOLIC PANEL
Anion gap: 14 (ref 5–15)
BUN: 29 mg/dL — ABNORMAL HIGH (ref 8–23)
CO2: 31 mmol/L (ref 22–32)
Calcium: 8.7 mg/dL — ABNORMAL LOW (ref 8.9–10.3)
Chloride: 97 mmol/L — ABNORMAL LOW (ref 98–111)
Creatinine, Ser: 1.34 mg/dL — ABNORMAL HIGH (ref 0.44–1.00)
GFR, Estimated: 39 mL/min — ABNORMAL LOW (ref >60)
Glucose, Bld: 141 mg/dL — ABNORMAL HIGH (ref 70–99)
Potassium: 4.3 mmol/L (ref 3.5–5.1)
Sodium: 142 mmol/L (ref 135–145)

## 2023-11-13 LAB — SURGICAL PCR SCREEN
MRSA, PCR: POSITIVE — AB
Staphylococcus aureus: POSITIVE — AB

## 2023-11-13 SURGERY — FIXATION, FRACTURE, INTERTROCHANTERIC, WITH INTRAMEDULLARY ROD
Anesthesia: General | Laterality: Left

## 2023-11-13 MED ORDER — PROPOFOL 10 MG/ML IV BOLUS
INTRAVENOUS | Status: AC
  Filled 2023-11-13: qty 20

## 2023-11-13 MED ORDER — ONDANSETRON HCL 4 MG/2ML IJ SOLN
INTRAMUSCULAR | Status: AC
  Filled 2023-11-13: qty 2

## 2023-11-13 MED ORDER — MENTHOL 3 MG MT LOZG: 1.0000 | LOZENGE | OROMUCOSAL | Status: DC | PRN

## 2023-11-13 MED ORDER — CEFAZOLIN SODIUM-DEXTROSE 2-3 GM-%(50ML) IV SOLR
INTRAVENOUS | Status: DC | PRN
  Administered 2023-11-13: 2 g via INTRAVENOUS

## 2023-11-13 MED ORDER — DEXAMETHASONE SODIUM PHOSPHATE 10 MG/ML IJ SOLN
INTRAMUSCULAR | Status: AC
  Filled 2023-11-13: qty 1

## 2023-11-13 MED ORDER — ENOXAPARIN SODIUM 40 MG/0.4ML IJ SOSY
40.0000 mg | PREFILLED_SYRINGE | INTRAMUSCULAR | Status: DC
  Administered 2023-11-14 – 2023-11-21 (×8): 40 mg via SUBCUTANEOUS
  Filled 2023-11-13 (×8): qty 0.4

## 2023-11-13 MED ORDER — METOCLOPRAMIDE HCL 5 MG/ML IJ SOLN: 5.0000 mg | Freq: Three times a day (TID) | INTRAMUSCULAR | Status: DC | PRN

## 2023-11-13 MED ORDER — 0.9 % SODIUM CHLORIDE (POUR BTL) OPTIME
TOPICAL | Status: DC | PRN
  Administered 2023-11-13: 1000 mL

## 2023-11-13 MED ORDER — PROPOFOL 10 MG/ML IV BOLUS
INTRAVENOUS | Status: DC | PRN
  Administered 2023-11-13: 60 mg via INTRAVENOUS

## 2023-11-13 MED ORDER — TRANEXAMIC ACID-NACL 1000-0.7 MG/100ML-% IV SOLN
1000.0000 mg | Freq: Once | INTRAVENOUS | Status: AC
  Administered 2023-11-13: 1000 mg via INTRAVENOUS
  Filled 2023-11-13: qty 100

## 2023-11-13 MED ORDER — TORSEMIDE 20 MG PO TABS
80.0000 mg | ORAL_TABLET | Freq: Every day | ORAL | Status: DC
  Filled 2023-11-13: qty 4

## 2023-11-13 MED ORDER — SUGAMMADEX SODIUM 200 MG/2ML IV SOLN
INTRAVENOUS | Status: DC | PRN
  Administered 2023-11-13: 150 mg via INTRAVENOUS
  Administered 2023-11-13: 50 mg via INTRAVENOUS
  Administered 2023-11-13: 200 mg via INTRAVENOUS

## 2023-11-13 MED ORDER — ONDANSETRON HCL 4 MG/2ML IJ SOLN
INTRAMUSCULAR | Status: DC | PRN
  Administered 2023-11-13: 4 mg via INTRAVENOUS

## 2023-11-13 MED ORDER — FENTANYL CITRATE (PF) 250 MCG/5ML IJ SOLN
INTRAMUSCULAR | Status: DC | PRN
  Administered 2023-11-13: 50 ug via INTRAVENOUS
  Administered 2023-11-13: 25 ug via INTRAVENOUS

## 2023-11-13 MED ORDER — SUCCINYLCHOLINE CHLORIDE 200 MG/10ML IV SOSY
PREFILLED_SYRINGE | INTRAVENOUS | Status: DC | PRN
  Administered 2023-11-13: 100 mg via INTRAVENOUS

## 2023-11-13 MED ORDER — LIDOCAINE 2% (20 MG/ML) 5 ML SYRINGE
INTRAMUSCULAR | Status: AC
  Filled 2023-11-13: qty 5

## 2023-11-13 MED ORDER — ORAL CARE MOUTH RINSE: 15.0000 mL | Freq: Once | OROMUCOSAL | Status: DC

## 2023-11-13 MED ORDER — ROCURONIUM BROMIDE 10 MG/ML (PF) SYRINGE
PREFILLED_SYRINGE | INTRAVENOUS | Status: AC
  Filled 2023-11-13: qty 10

## 2023-11-13 MED ORDER — LIDOCAINE 2% (20 MG/ML) 5 ML SYRINGE
INTRAMUSCULAR | Status: DC | PRN
  Administered 2023-11-13: 40 mg via INTRAVENOUS

## 2023-11-13 MED ORDER — PHENYLEPHRINE 80 MCG/ML (10ML) SYRINGE FOR IV PUSH (FOR BLOOD PRESSURE SUPPORT)
PREFILLED_SYRINGE | INTRAVENOUS | Status: AC
  Filled 2023-11-13: qty 20

## 2023-11-13 MED ORDER — LACTATED RINGERS IV SOLN
INTRAVENOUS | Status: AC
Start: 2023-11-13 — End: 2023-11-14

## 2023-11-13 MED ORDER — LACTATED RINGERS IV SOLN: INTRAVENOUS | Status: DC

## 2023-11-13 MED ORDER — METOCLOPRAMIDE HCL 5 MG PO TABS: 5.0000 mg | ORAL_TABLET | Freq: Three times a day (TID) | ORAL | Status: DC | PRN

## 2023-11-13 MED ORDER — DEXAMETHASONE SODIUM PHOSPHATE 10 MG/ML IJ SOLN
INTRAMUSCULAR | Status: DC | PRN
  Administered 2023-11-13: 5 mg via INTRAVENOUS

## 2023-11-13 MED ORDER — PHENYLEPHRINE 80 MCG/ML (10ML) SYRINGE FOR IV PUSH (FOR BLOOD PRESSURE SUPPORT)
PREFILLED_SYRINGE | INTRAVENOUS | Status: DC | PRN
  Administered 2023-11-13: 80 ug via INTRAVENOUS
  Administered 2023-11-13 (×2): 160 ug via INTRAVENOUS
  Administered 2023-11-13: 80 ug via INTRAVENOUS

## 2023-11-13 MED ORDER — ROCURONIUM BROMIDE 10 MG/ML (PF) SYRINGE
PREFILLED_SYRINGE | INTRAVENOUS | Status: DC | PRN
  Administered 2023-11-13: 50 mg via INTRAVENOUS
  Administered 2023-11-13: 10 mg via INTRAVENOUS

## 2023-11-13 MED ORDER — PHENOL 1.4 % MT LIQD: 1.0000 | OROMUCOSAL | Status: DC | PRN

## 2023-11-13 MED ORDER — FENTANYL CITRATE (PF) 250 MCG/5ML IJ SOLN
INTRAMUSCULAR | Status: AC
  Filled 2023-11-13: qty 5

## 2023-11-13 MED ORDER — FENTANYL CITRATE (PF) 100 MCG/2ML IJ SOLN: 25.0000 ug | INTRAMUSCULAR | Status: DC | PRN

## 2023-11-13 MED ORDER — CEFAZOLIN SODIUM 1 G IJ SOLR
INTRAMUSCULAR | Status: AC
  Filled 2023-11-13: qty 20

## 2023-11-13 MED ORDER — DOCUSATE SODIUM 100 MG PO CAPS
100.0000 mg | ORAL_CAPSULE | Freq: Two times a day (BID) | ORAL | Status: DC
  Administered 2023-11-14 – 2023-11-21 (×8): 100 mg via ORAL
  Filled 2023-11-13 (×12): qty 1

## 2023-11-13 MED ORDER — CHLORHEXIDINE GLUCONATE 0.12 % MT SOLN: 15.0000 mL | Freq: Once | OROMUCOSAL | Status: DC

## 2023-11-13 SURGICAL SUPPLY — 50 items
BAG COUNTER SPONGE SURGICOUNT (BAG) ×1 IMPLANT
BIT DRILL CROWE POINT TWST 4.3 (DRILL) IMPLANT
BNDG COHESIVE 6X5 TAN ST LF (GAUZE/BANDAGES/DRESSINGS) IMPLANT
BRUSH SCRUB EZ PLAIN DRY (MISCELLANEOUS) ×2 IMPLANT
CORTICAL BONE SCR 5.0MM X 48MM (Screw) ×1 IMPLANT
COVER PERINEAL POST (MISCELLANEOUS) ×1 IMPLANT
COVER SURGICAL LIGHT HANDLE (MISCELLANEOUS) ×2 IMPLANT
DRAPE C-ARM 42X72 X-RAY (DRAPES) ×1 IMPLANT
DRAPE C-ARMOR (DRAPES) ×1 IMPLANT
DRAPE HALF SHEET 40X57 (DRAPES) IMPLANT
DRAPE INCISE IOBAN 66X45 STRL (DRAPES) ×1 IMPLANT
DRAPE SURG ORHT 6 SPLT 77X108 (DRAPES) IMPLANT
DRAPE U-SHAPE 47X51 STRL (DRAPES) ×1 IMPLANT
DRESSING MEPILEX FLEX 4X4 (GAUZE/BANDAGES/DRESSINGS) ×1 IMPLANT
DRILL CROWE POINT TWIST 4.3 (DRILL) ×1
DRSG EMULSION OIL 3X3 NADH (GAUZE/BANDAGES/DRESSINGS) ×1 IMPLANT
DRSG MEPILEX FLEX 4X4 (GAUZE/BANDAGES/DRESSINGS) ×3
DRSG MEPILEX POST OP 4X8 (GAUZE/BANDAGES/DRESSINGS) ×1 IMPLANT
ELECT REM PT RETURN 9FT ADLT (ELECTROSURGICAL) ×1
ELECTRODE REM PT RTRN 9FT ADLT (ELECTROSURGICAL) ×1 IMPLANT
GLOVE BIO SURGEON STRL SZ7.5 (GLOVE) ×1 IMPLANT
GLOVE BIO SURGEON STRL SZ8 (GLOVE) ×1 IMPLANT
GLOVE BIOGEL PI IND STRL 7.5 (GLOVE) ×1 IMPLANT
GLOVE BIOGEL PI IND STRL 8 (GLOVE) ×1 IMPLANT
GLOVE SURG ORTHO LTX SZ7.5 (GLOVE) ×2 IMPLANT
GOWN STRL REUS W/ TWL LRG LVL3 (GOWN DISPOSABLE) ×2 IMPLANT
GOWN STRL REUS W/ TWL XL LVL3 (GOWN DISPOSABLE) ×1 IMPLANT
GUIDEPIN VERSANAIL DSP 3.2X444 (ORTHOPEDIC DISPOSABLE SUPPLIES) IMPLANT
GUIDEWIRE BALL NOSE 100CM (WIRE) IMPLANT
HIP FRA NAIL LAG SCREW 10.5X90 (Orthopedic Implant) ×1 IMPLANT
KIT BASIN OR (CUSTOM PROCEDURE TRAY) ×1 IMPLANT
KIT TURNOVER KIT B (KITS) ×1 IMPLANT
MANIFOLD NEPTUNE II (INSTRUMENTS) ×1 IMPLANT
NAIL HIP FRA AFFIX 130X9X400 L (Nail) IMPLANT
NS IRRIG 1000ML POUR BTL (IV SOLUTION) ×1 IMPLANT
PACK GENERAL/GYN (CUSTOM PROCEDURE TRAY) ×1 IMPLANT
PAD ARMBOARD 7.5X6 YLW CONV (MISCELLANEOUS) ×2 IMPLANT
SCREW BONE CORTICAL 5.0X44 (Screw) IMPLANT
SCREW CORTICL BON 5.0MM X 48MM (Screw) IMPLANT
SCREW LAG HIP FRA NAIL 10.5X90 (Orthopedic Implant) IMPLANT
STAPLER VISISTAT 35W (STAPLE) ×1 IMPLANT
STOCKINETTE IMPERVIOUS LG (DRAPES) IMPLANT
SUT ETHILON 2 0 FS 18 (SUTURE) IMPLANT
SUT ETHILON 2 0 PSLX (SUTURE) ×1 IMPLANT
SUT VIC AB 0 CT1 27XBRD ANBCTR (SUTURE) ×1 IMPLANT
SUT VIC AB 1 CT1 27XBRD ANBCTR (SUTURE) ×1 IMPLANT
SUT VIC AB 2-0 CT1 TAPERPNT 27 (SUTURE) ×1 IMPLANT
TOWEL GREEN STERILE (TOWEL DISPOSABLE) ×2 IMPLANT
TOWEL GREEN STERILE FF (TOWEL DISPOSABLE) ×1 IMPLANT
WATER STERILE IRR 1000ML POUR (IV SOLUTION) ×1 IMPLANT

## 2023-11-13 NOTE — Progress Notes (Signed)
RT called to PACU bay10 to place pt on cpap, 5L O2 bled in, sats 92%, pt to wear while sleeping after procedure.   11/13/23 1316  BiPAP/CPAP/SIPAP  $ Face Mask Medium Yes  BiPAP/CPAP/SIPAP Pt Type Adult  BiPAP/CPAP/SIPAP Resmed  Mask Type Full face mask  Mask Size Medium  Respiratory Rate 19 breaths/min  IPAP 12 cmH20  EPAP 6 cmH2O  FiO2 (%) 40 %  Patient Home Equipment No  Auto Titrate Yes

## 2023-11-13 NOTE — Progress Notes (Signed)
No changes overnight.  The risks and benefits of left hip repair were discussed with the patient's family including the possibility of infection, nerve injury, vessel injury, wound breakdown, arthritis, symptomatic hardware, DVT/ PE, loss of motion, malunion, nonunion, and need for further surgery among others.  These risks were acknowledged and consent was provided to proceed.  Myrene Galas, MD [facility], [facility] [phone]

## 2023-11-13 NOTE — TOC CAGE-AID Note (Signed)
Transition of Care [facility]) - CAGE-AID Screening   Patient Details  Name: Patricia Donaldson MRN: [id number] Date of Birth: 11-02-1940  Transition of Care [facility]) CM/SW Contact:    Leota Sauers, RN Phone Number: 11/13/2023, 6:11 AM   Clinical Narrative:  Patient denies the use of alcohol and illicit substances. Resources not given at this time.   CAGE-AID Screening:    Have You Ever Felt You Ought to Cut Down on Your Drinking or Drug Use?: No Have People Annoyed You By Critizing Your Drinking Or Drug Use?: No Have You Felt Bad Or Guilty About Your Drinking Or Drug Use?: No Have You Ever Had a Drink or Used Drugs First Thing In The Morning to Steady Your Nerves or to Get Rid of a Hangover?: No CAGE-AID Score: 0  Substance Abuse Education Offered: No

## 2023-11-13 NOTE — Anesthesia Preprocedure Evaluation (Addendum)
Anesthesia Evaluation  Patient identified by MRN, date of birth, ID band Patient confused    Reviewed: Allergy & Precautions, Patient's Chart, lab work & pertinent test results  Airway Mallampati: Unable to assess  TM Distance: <3 FB     Dental  (+) Teeth Intact, Dental Advisory Given   Pulmonary asthma , former smoker    + decreased breath sounds      Cardiovascular hypertension, Pt. on home beta blockers + dysrhythmias Atrial Fibrillation  Rhythm:Regular Rate:Normal     Neuro/Psych Seizures -,   Anxiety     TIACVA    GI/Hepatic negative GI ROS, Neg liver ROS,,,  Endo/Other  negative endocrine ROS    Renal/GU negative Renal ROS     Musculoskeletal  (+) Arthritis ,    Abdominal   Peds  Hematology negative hematology ROS (+)   Anesthesia Other Findings   Reproductive/Obstetrics                             Anesthesia Physical Anesthesia Plan  ASA: 3  Anesthesia Plan: General   Post-op Pain Management: Tylenol PO (pre-op)*   Induction: Intravenous  PONV Risk Score and Plan: 4 or greater and Ondansetron, Dexamethasone and Treatment may vary due to age or medical condition  Airway Management Planned: Oral ETT  Additional Equipment: None  Intra-op Plan:   Post-operative Plan: Extubation in OR  Informed Consent: I have reviewed the patients History and Physical, chart, labs and discussed the procedure including the risks, benefits and alternatives for the proposed anesthesia with the patient or authorized representative who has indicated his/her understanding and acceptance.   Patient has DNR.  Discussed DNR with power of attorney and Continue DNR.   Dental advisory given and Consent reviewed with POA  Plan Discussed with: CRNA  Anesthesia Plan Comments: (Delayed due to consent issues.)       Anesthesia Quick Evaluation

## 2023-11-13 NOTE — Anesthesia Procedure Notes (Signed)
Procedure Name: Intubation Date/Time: 11/13/2023 11:26 AM  Performed by: Thomasene Ripple, CRNAPre-anesthesia Checklist: Patient identified, Emergency Drugs available, Suction available and Patient being monitored Patient Re-evaluated:Patient Re-evaluated prior to induction Oxygen Delivery Method: Circle System Utilized Preoxygenation: Pre-oxygenation with 100% oxygen Induction Type: IV induction Ventilation: Mask ventilation without difficulty Laryngoscope Size: Miller and 3 Grade View: Grade I Tube type: Oral Tube size: 7.0 mm Number of attempts: 1 Airway Equipment and Method: Stylet and Oral airway Placement Confirmation: ETT inserted through vocal cords under direct vision, positive ETCO2 and breath sounds checked- equal and bilateral Secured at: 21 cm Tube secured with: Tape Dental Injury: Teeth and Oropharynx as per pre-operative assessment

## 2023-11-13 NOTE — Transfer of Care (Signed)
Immediate Anesthesia Transfer of Care Note  Patient: CAMREE CABA  Procedure(s) Performed: INTRAMEDULLARY (IM) NAIL INTERTROCHANTERIC (Left)  Patient Location: PACU  Anesthesia Type:General  Level of Consciousness: drowsy  Airway & Oxygen Therapy: Patient Spontanous Breathing and Patient connected to face mask oxygen  Post-op Assessment: Report given to RN and Post -op Vital signs reviewed and stable  Post vital signs: Reviewed and stable  Last Vitals:  Vitals Value Taken Time  BP 134/83 11/13/23 1315  Temp    Pulse 90 11/13/23 1317  Resp 20 11/13/23 1317  SpO2 91 % 11/13/23 1317  Vitals shown include unfiled device data.  Last Pain:  Vitals:   11/13/23 0901  TempSrc:   PainSc: Asleep         Complications: No notable events documented.

## 2023-11-13 NOTE — Plan of Care (Signed)
  Problem: Education: Goal: Knowledge of General Education information will improve Description Including pain rating scale, medication(s)/side effects and non-pharmacologic comfort measures Outcome: Progressing   

## 2023-11-13 NOTE — Op Note (Signed)
11/13/2023  PATIENT:  Patricia Donaldson  03-Feb-1941 female   MEDICAL RECORD NUMBER: [id number]  PRE-OPERATIVE DIAGNOSIS:  LEFT INTERTROCHANTERIC HIP FRACTURE  POST-OPERATIVE DIAGNOSIS: LEFT INTERTROCHANTERIC HIP FRACTURE   PROCEDURE:  INTRAMEDULLARY NAILING OF THE LEFT HIP using a statically locked [employer] Affixus nail 9 X 400.  SURGEON:  Doralee Albino. Carola Frost, M.D.  ASSISTANT:  Montez Morita, PA-C.  ANESTHESIA:  General.  COMPLICATIONS:  None.  ESTIMATED BLOOD LOSS:  Less than 150 mL.  DISPOSITION:  To PACU.  CONDITION:  Stable.  DELAY START OF DVT PROPHYLAXIS BECAUSE OF BLEEDING RISK: NO  BRIEF SUMMARY AND INDICATION OF PROCEDURE:  Patricia Donaldson is a 83 y.o. year- old with multiple medical problems.  I discussed with the patient and family risks and benefits of surgical treatment including the potential for malunion, nonunion, symptomatic hardware, heart attack, stroke, neurovascular injury, bleeding, and others.  After acknowledging these risks, consent was provided consent to proceed.  BRIEF SUMMARY OF PROCEDURE:  The patient was taken to the operating room where general anesthesia was induced.  She was positioned supine on the Hana fracture table.  A closed reduction maneuver was performed of the fractured proximal femur and this was confirmed on both AP and lateral xray views. A thorough scrub and wash with chlorhexidine and then Betadine scrub and paint was performed.  After sterile drapes and time-out, a long instrument was used to identify the appropriate starting position under C-arm on both AP and lateral images.  A 3 cm incision was made proximal to the greater trochanter.  The curved cannulated awl was inserted just medial to the tip of the lateral trochanter and then the starting guidewire advanced into the proximal femur.  This was checked on AP and lateral views.  The starting reamer was engaged with the soft tissue protected by a sleeve.  The curved ball-tipped guidewire was  then inserted, making sure it was just posterior as possible in the distal femur and across the fracture site, which stayed in a reduced position.  It was sequentially reamed up to 11 and an 9 X 400 mm nail inserted to the appropriate depth.  The guidewire for the lag screw was then inserted with the appropriate anteversion to make sure it was in a center-center position.  This was measured and the lag screw placed with excellent purchase and position checked on both views.  The set screw was then engaged within the groove of the lag screw, which was allowed to telescope.  Traction was released and compression achieved with the compression device over the lag screw.  This was followed by placement of two distal locking screws using perfect circle technique.  This was confirmed on AP and lateral images. Wounds were irrigated thoroughly, closed in a standard layered fashion. Sterile gently compressive dressings were applied.  Montez Morita, PA-C, assisted throughout.  The patient was awakened from anesthesia and transported to the PACU in stable condition.  PROGNOSIS:  The patient will be weightbearing as tolerated with physical therapy beginning DVT prophylaxis with Lovenox.  She has no range of motion precautions.  We will continue to follow through at the hospital.  Anticipate follow up in the office in 2 weeks for removal of sutures and further evaluation.     Doralee Albino. Carola Frost, M.D.

## 2023-11-13 NOTE — Progress Notes (Signed)
Palliative:  Consult received and chart reviewed. Unfortunately patient has been in OR most of day and now in PACU - have been unable to see. Will plan to see 1/17  Gerlean Ren, DNP, [facility] Palliative Medicine Team Team Phone # [phone]  Pager # [phone]  NO CHARGE

## 2023-11-13 NOTE — Progress Notes (Addendum)
PROGRESS NOTE    GEARLENE DEASY  ZOX:[id number] DOB: 1941-03-17 DOA: 11/12/2023 PCP: Smiley Houseman, NP    Chief Complaint  Patient presents with   Fall   Hip Pain    Brief Narrative:  Patient is a 83 year old female history of dementia and memory care unit at a nursing home and presented to the ED with an unwitnessed fall with inability to get up.  Imaging done concerning for left femoral fracture.  Orthopedics consulted and patient for IM nail today.  Admitting physician discussed with patient's son, Argie Amsterdam over the telephone who clearly advised patient to receive only palliative/comfort measures.  Patient would not want any treatment with curative intent unless comfort factors overwhelming.   Assessment & Plan:   Principal Problem:   Fall at home, initial encounter Active Problems:   Femoral fracture (HCC)   Hyperlipidemia   Hypertension   Atrial fibrillation, chronic (HCC)   Compression fracture of body of thoracic vertebra (HCC)   Seizure (HCC)   Fall   Dementia with behavioral disturbance (HCC)   Anxiety  1 acute left commuted intertrochanteric fracture of proximal left femur with varus angulation -Secondary to mechanical fall. -Patient seen in consultation by orthopedics and patient for IM nail today per orthopedics. -Continue current pain management.  2.  Seizure disorder -Stable. -Continue home regimen Depakote sprinkles and Keppra.  3.  Compression fracture of the body of the thoracic vertebrae -Likely chronic. -Continue current pain management.  4.  Hypertension -Continue home regimen Coreg.  5.  Dementia -Continue home regimen Aricept, risperidone, Depakote sprinkles.  6.  Hyperlipidemia -Hold statin and resume on discharge.  7.  Anxiety -Continue home regimen Zoloft and Ativan.  8.  Chronic atrial fibrillation -Continue Coreg for rate control. -Patient not on anticoagulation prior to admission due to history of hemorrhagic stroke on  anticoagulation per outpatient neurology note on 02/19/2022.   DVT prophylaxis: Per orthopedics Code Status: Full Family Communication: No family at bedside.  Tried calling son on telephone but went to voicemail. Disposition: TBD  Status is: Inpatient Remains inpatient appropriate because: Severity of illness   Consultants:  Orthopedics: Earney Hamburg, PA 11/12/2023  Procedures:  CT head CT C-spine 11/12/2023 Chest x-ray 11/12/2023 Plain films of the pelvis 11/12/2023 Plain films of the left femur 11/12/2023  Antimicrobials:  Anti-infectives (From admission, onward)    None         Subjective: Patient sleeping.  Patient in preop area.  Objective: Vitals:   11/13/23 0730 11/13/23 0800 11/13/23 0822 11/13/23 0838  BP: 117/71 111/74  119/79  Pulse: 80 77  77  Resp: 15 14  18   Temp:   97.6 F (36.4 C) 98.1 F (36.7 C)  TempSrc:   Axillary Oral  SpO2: 99% 100%  97%  Weight:    68.9 kg  Height:    5\' 4"  (1.626 m)   No intake or output data in the 24 hours ending 11/13/23 0948 Filed Weights   11/12/23 0853 11/13/23 0838  Weight: 68.9 kg 68.9 kg    Examination:  General exam: Sleeping.  Dry mucous membranes. Respiratory system: Clear to auscultation anterior lung fields.  No wheezes, no crackles, no rhonchi.Marland Kitchen Respiratory effort normal. Cardiovascular system: S1 & S2 heard, RRR. No JVD, murmurs, rubs, gallops or clicks. No pedal edema. Gastrointestinal system: Abdomen is nondistended, soft and nontender. No organomegaly or masses felt. Normal bowel sounds heard. Central nervous system: Alert and oriented. No focal neurological deficits. Extremities: Left lower extremity in  Buck's traction.  Skin: No rashes, lesions or ulcers Psychiatry: Judgement and insight unable to assess. Mood & affect unable to assess.     Data Reviewed: I have personally reviewed following labs and imaging studies  CBC: Recent Labs  Lab 11/12/23 0918  WBC 9.7  NEUTROABS 7.9*  HGB 13.0   HCT 40.4  MCV 89.4  PLT 258    Basic Metabolic Panel: Recent Labs  Lab 11/12/23 1010 11/12/23 1630  NA 140 138  K 2.9* 4.1  CL 93* 95*  CO2 36* 36*  GLUCOSE 137* 151*  BUN 20 21  CREATININE 0.98 1.07*  CALCIUM 8.4* 8.2*    GFR: Estimated Creatinine Clearance: 37.3 mL/min (A) (by C-G formula based on SCr of 1.07 mg/dL (H)).  Liver Function Tests: Recent Labs  Lab 11/12/23 1010  AST 30  ALT 25  ALKPHOS 56  BILITOT 0.8  PROT 6.7  ALBUMIN 2.7*    CBG: No results for input(s): "GLUCAP" in the last 168 hours.   No results found for this or any previous visit (from the past 240 hours).       Radiology Studies: DG Pelvis Portable Result Date: 11/12/2023 CLINICAL DATA:  Fall EXAM: PORTABLE PELVIS 1-2 VIEWS; LEFT FEMUR PORTABLE 2 VIEWS COMPARISON:  None Available. FINDINGS: Acute, comminuted intertrochanteric fracture of the proximal left femur with approximately 90 degrees of varus angulation. Hip joint alignment is maintained without dislocation. Bony pelvis intact without evidence of fracture or diastasis. Knee joint alignment maintained with medial compartment joint space narrowing. IMPRESSION: Acute, comminuted intertrochanteric fracture of the proximal left femur with varus angulation. Electronically Signed   By: Duanne Guess D.O.   On: 11/12/2023 11:05   DG FEMUR PORT MIN 2 VIEWS LEFT Result Date: 11/12/2023 CLINICAL DATA:  Fall EXAM: PORTABLE PELVIS 1-2 VIEWS; LEFT FEMUR PORTABLE 2 VIEWS COMPARISON:  None Available. FINDINGS: Acute, comminuted intertrochanteric fracture of the proximal left femur with approximately 90 degrees of varus angulation. Hip joint alignment is maintained without dislocation. Bony pelvis intact without evidence of fracture or diastasis. Knee joint alignment maintained with medial compartment joint space narrowing. IMPRESSION: Acute, comminuted intertrochanteric fracture of the proximal left femur with varus angulation. Electronically  Signed   By: Duanne Guess D.O.   On: 11/12/2023 11:05   DG Chest Port 1 View Result Date: 11/12/2023 CLINICAL DATA:  Fall EXAM: PORTABLE CHEST 1 VIEW COMPARISON:  10/19/2022 FINDINGS: Cardiomegaly. Aortic atherosclerosis. No focal airspace consolidation, pleural effusion, or pneumothorax. IMPRESSION: No active disease. Electronically Signed   By: Duanne Guess D.O.   On: 11/12/2023 11:04   CT Head Wo Contrast Result Date: 11/12/2023 CLINICAL DATA:  Head trauma, minor (Age >= 65y); Neck trauma (Age >= 65y) EXAM: CT HEAD WITHOUT CONTRAST CT CERVICAL SPINE WITHOUT CONTRAST TECHNIQUE: Multidetector CT imaging of the head and cervical spine was performed following the standard protocol without intravenous contrast. Multiplanar CT image reconstructions of the cervical spine were also generated. RADIATION DOSE REDUCTION: This exam was performed according to the departmental dose-optimization program which includes automated exposure control, adjustment of the mA and/or kV according to patient size and/or use of iterative reconstruction technique. COMPARISON:  None Available. FINDINGS: CT HEAD FINDINGS Brain: No hemorrhage. No hydrocephalus. No extra-axial fluid collection. No mass effect. No mass lesion. No CT evidence of an acute cortical infarct. 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 Vascular: No hyperdense vessel or unexpected calcification. Skull: Normal. Negative  for fracture or focal lesion. Sinuses/Orbits: No middle ear or mastoid effusion. Paranasal sinuses are notable for frothy secretions in the left sphenoid sinus. Bilateral lens replacement. Orbits are otherwise unremarkable. Other: None. CT CERVICAL SPINE FINDINGS Alignment: Normal. Skull base and vertebrae: No acute fracture. No primary bone lesion or focal pathologic process. Soft tissues and spinal canal: No prevertebral fluid or swelling. No visible canal hematoma.  Disc levels:  No CT evidence of high-grade spinal canal stenosis. Upper chest: Negative. Other: There is an age indeterminate minimal superior endplate compression deformity at T2. Correlate with point tenderness. IMPRESSION: 1. No CT evidence of intracranial injury. 2. No acute fracture or traumatic subluxation of the cervical spine. 3. Age indeterminate minimal superior endplate compression deformity at T2. Correlate with point tenderness. Electronically Signed   By: Lorenza Cambridge M.D.   On: 11/12/2023 10:12   CT Cervical Spine Wo Contrast Result Date: 11/12/2023 CLINICAL DATA:  Head trauma, minor (Age >= 65y); Neck trauma (Age >= 65y) EXAM: CT HEAD WITHOUT CONTRAST CT CERVICAL SPINE WITHOUT CONTRAST TECHNIQUE: Multidetector CT imaging of the head and cervical spine was performed following the standard protocol without intravenous contrast. Multiplanar CT image reconstructions of the cervical spine were also generated. RADIATION DOSE REDUCTION: This exam was performed according to the departmental dose-optimization program which includes automated exposure control, adjustment of the mA and/or kV according to patient size and/or use of iterative reconstruction technique. COMPARISON:  None Available. FINDINGS: CT HEAD FINDINGS Brain: No hemorrhage. No hydrocephalus. No extra-axial fluid collection. No mass effect. No mass lesion. No CT evidence of an acute cortical infarct. 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 Vascular: No hyperdense vessel or unexpected calcification. Skull: Normal. Negative for fracture or focal lesion. Sinuses/Orbits: No middle ear or mastoid effusion. Paranasal sinuses are notable for frothy secretions in the left sphenoid sinus. Bilateral lens replacement. Orbits are otherwise unremarkable. Other: None. CT CERVICAL SPINE FINDINGS Alignment: Normal. Skull base and vertebrae: No acute fracture. No primary bone  lesion or focal pathologic process. Soft tissues and spinal canal: No prevertebral fluid or swelling. No visible canal hematoma. Disc levels:  No CT evidence of high-grade spinal canal stenosis. Upper chest: Negative. Other: There is an age indeterminate minimal superior endplate compression deformity at T2. Correlate with point tenderness. IMPRESSION: 1. No CT evidence of intracranial injury. 2. No acute fracture or traumatic subluxation of the cervical spine. 3. Age indeterminate minimal superior endplate compression deformity at T2. Correlate with point tenderness. Electronically Signed   By: Lorenza Cambridge M.D.   On: 11/12/2023 10:12        Scheduled Meds:  [MAR Hold] acetaminophen  650 mg Oral Q4H   [MAR Hold] carvedilol  12.5 mg Oral BID   [MAR Hold] celecoxib  200 mg Oral BID   chlorhexidine  15 mL Mouth/Throat Once   Or   mouth rinse  15 mL Mouth Rinse Once   [MAR Hold] cholecalciferol  400 Units Oral Daily   [MAR Hold] cyanocobalamin  1,000 mcg Oral Daily   [MAR Hold] divalproex  125 mg Oral BID   [MAR Hold] donepezil  10 mg Oral Daily   [MAR Hold] fentaNYL (SUBLIMAZE) injection  25 mcg Intravenous Once   [MAR Hold] levETIRAcetam  500 mg Oral BID   [MAR Hold] LORazepam  0.25 mg Oral Q12H   [MAR Hold] risperiDONE  0.5 mg Oral QHS   [MAR Hold] sertraline  75  mg Oral Daily   [MAR Hold] sodium chloride flush  3 mL Intravenous Q12H   [START ON 11/14/2023] torsemide  80 mg Oral Daily   Continuous Infusions:  lactated ringers     lactated ringers       LOS: 1 day    Time spent: 35 minutes    Ramiro Harvest, MD [facility]   To contact the attending provider between 7A-7P or the covering provider during after hours 7P-7A, please log into the web site [email or web address] and access using universal [place] password for that web site. If you do not have the password, please call the hospital operator.  11/13/2023, 9:48 AM

## 2023-11-14 ENCOUNTER — Encounter (HOSPITAL_COMMUNITY): Payer: Self-pay | Admitting: Orthopedic Surgery

## 2023-11-14 DIAGNOSIS — S72352P Displaced comminuted fracture of shaft of left femur, subsequent encounter for closed fracture with malunion: Secondary | ICD-10-CM | POA: Diagnosis not present

## 2023-11-14 DIAGNOSIS — I10 Essential (primary) hypertension: Secondary | ICD-10-CM | POA: Diagnosis not present

## 2023-11-14 DIAGNOSIS — Z7189 Other specified counseling: Secondary | ICD-10-CM

## 2023-11-14 DIAGNOSIS — I482 Chronic atrial fibrillation, unspecified: Secondary | ICD-10-CM

## 2023-11-14 DIAGNOSIS — W19XXXA Unspecified fall, initial encounter: Secondary | ICD-10-CM | POA: Diagnosis not present

## 2023-11-14 DIAGNOSIS — F03918 Unspecified dementia, unspecified severity, with other behavioral disturbance: Secondary | ICD-10-CM | POA: Diagnosis not present

## 2023-11-14 LAB — URINALYSIS, COMPLETE (UACMP) WITH MICROSCOPIC
Bilirubin Urine: NEGATIVE
Glucose, UA: NEGATIVE mg/dL
Hgb urine dipstick: NEGATIVE
Ketones, ur: NEGATIVE mg/dL
Leukocytes,Ua: NEGATIVE
Nitrite: NEGATIVE
Protein, ur: NEGATIVE mg/dL
Specific Gravity, Urine: 1.015 (ref 1.005–1.030)
pH: 5 (ref 5.0–8.0)

## 2023-11-14 LAB — CBC WITH DIFFERENTIAL/PLATELET
Abs Immature Granulocytes: 0.1 10*3/uL — ABNORMAL HIGH (ref 0.00–0.07)
Basophils Absolute: 0 10*3/uL (ref 0.0–0.1)
Basophils Relative: 0 %
Eosinophils Absolute: 0 10*3/uL (ref 0.0–0.5)
Eosinophils Relative: 0 %
HCT: 35.7 % — ABNORMAL LOW (ref 36.0–46.0)
Hemoglobin: 11.2 g/dL — ABNORMAL LOW (ref 12.0–15.0)
Immature Granulocytes: 1 %
Lymphocytes Relative: 12 %
Lymphs Abs: 1.5 10*3/uL (ref 0.7–4.0)
MCH: 28.9 pg (ref 26.0–34.0)
MCHC: 31.4 g/dL (ref 30.0–36.0)
MCV: 92.2 fL (ref 80.0–100.0)
Monocytes Absolute: 1.1 10*3/uL — ABNORMAL HIGH (ref 0.1–1.0)
Monocytes Relative: 9 %
Neutro Abs: 9.9 10*3/uL — ABNORMAL HIGH (ref 1.7–7.7)
Neutrophils Relative %: 78 %
Platelets: 215 10*3/uL (ref 150–400)
RBC: 3.87 MIL/uL (ref 3.87–5.11)
RDW: 13.7 % (ref 11.5–15.5)
WBC: 12.6 10*3/uL — ABNORMAL HIGH (ref 4.0–10.5)
nRBC: 0 % (ref 0.0–0.2)

## 2023-11-14 LAB — BASIC METABOLIC PANEL
Anion gap: 15 (ref 5–15)
BUN: 33 mg/dL — ABNORMAL HIGH (ref 8–23)
CO2: 26 mmol/L (ref 22–32)
Calcium: 8.6 mg/dL — ABNORMAL LOW (ref 8.9–10.3)
Chloride: 103 mmol/L (ref 98–111)
Creatinine, Ser: 1.2 mg/dL — ABNORMAL HIGH (ref 0.44–1.00)
GFR, Estimated: 45 mL/min — ABNORMAL LOW (ref >60)
Glucose, Bld: 115 mg/dL — ABNORMAL HIGH (ref 70–99)
Potassium: 4.7 mmol/L (ref 3.5–5.1)
Sodium: 144 mmol/L (ref 135–145)

## 2023-11-14 LAB — GLUCOSE, CAPILLARY
Glucose-Capillary: 111 mg/dL — ABNORMAL HIGH (ref 70–99)
Glucose-Capillary: 130 mg/dL — ABNORMAL HIGH (ref 70–99)
Glucose-Capillary: 97 mg/dL (ref 70–99)

## 2023-11-14 LAB — VITAMIN D 25 HYDROXY (VIT D DEFICIENCY, FRACTURES): Vit D, 25-Hydroxy: 50.86 ng/mL (ref 30–100)

## 2023-11-14 LAB — MAGNESIUM: Magnesium: 2.1 mg/dL (ref 1.7–2.4)

## 2023-11-14 MED ORDER — TORSEMIDE 20 MG PO TABS: 80.0000 mg | ORAL_TABLET | Freq: Every day | ORAL | Status: DC

## 2023-11-14 MED ORDER — LEVETIRACETAM IN NACL 500 MG/100ML IV SOLN
500.0000 mg | Freq: Once | INTRAVENOUS | Status: AC
  Administered 2023-11-14: 500 mg via INTRAVENOUS
  Filled 2023-11-14: qty 100

## 2023-11-14 MED ORDER — LACTATED RINGERS IV SOLN: INTRAVENOUS | Status: AC

## 2023-11-14 NOTE — Progress Notes (Signed)
OT Cancellation Note  Patient Details Name: Patricia Donaldson MRN: [id number] DOB: 12/03/1940   Cancelled Treatment:    Reason Eval/Treat Not Completed: Medical issues which prohibited therapy;Other (comment) (pt comfort care only)  Hope Budds 11/14/2023, 9:55 AM

## 2023-11-14 NOTE — Progress Notes (Signed)
PROGRESS NOTE    Patricia Donaldson  ZOX:[id number] DOB: December 31, 1940 DOA: 11/12/2023 PCP: Smiley Houseman, NP    Chief Complaint  Patient presents with   Fall   Hip Pain    Brief Narrative:  Patient is a 83 year old female history of dementia and memory care unit at a nursing home and presented to the ED with an unwitnessed fall with inability to get up.  Imaging done concerning for left femoral fracture.  Orthopedics consulted and patient for IM nail today.  Admitting physician discussed with patient's son, Cilla Bobbitt over the telephone who clearly advised patient to receive only palliative/comfort measures.  Patient would not want any treatment with curative intent unless comfort factors overwhelming.   Assessment & Plan:   Principal Problem:   Fall at home, initial encounter Active Problems:   Femoral fracture (HCC)   Hyperlipidemia   Hypertension   Atrial fibrillation, chronic (HCC)   Compression fracture of body of thoracic vertebra (HCC)   Seizure (HCC)   Fall   Dementia with behavioral disturbance (HCC)   Anxiety  1 acute left commuted intertrochanteric fracture of proximal left femur with varus angulation -Secondary to mechanical fall. -Patient seen in consultation by orthopedics and patient status post IM nail of left hip per orthopedics: Dr. Carola Frost 11/13/2023. -IM nail placed for palliation. -Continue current pain management. -Will need outpatient follow-up with orthopedics.  2.  Seizure disorder -Stable. -Continue home regimen Depakote sprinkles and Keppra.  3.  Compression fracture of the body of the thoracic vertebrae -Likely chronic. -Continue current pain management.  4.  Hypertension -Coreg.  5.  Dementia -Continue home regimen Aricept, risperidone, Depakote sprinkles.  6.  Hyperlipidemia -Continue to hold statin and may consider discontinuation on discharge at this is noted that patient was being followed in the outpatient setting by hospice.  7.   Anxiety -Continue Zoloft, Ativan.    8.  Chronic atrial fibrillation -Continue Coreg for rate control. -Patient not on anticoagulation prior to admission due to history of hemorrhagic stroke on anticoagulation per outpatient neurology note on 02/19/2022.  9.  Dehydration -Patient with dry mucous membranes on exam, looks dehydrated on examination. -Gentle hydration for the next 24 hours. -Hold diuretics.   DVT prophylaxis: Per orthopedics Code Status: Full Family Communication: No family at bedside.   Disposition: Likely back to facility with hospice following.  Status is: Inpatient Remains inpatient appropriate because: Severity of illness   Consultants:  Orthopedics: Earney Hamburg, PA 11/12/2023  Procedures:  CT head CT C-spine 11/12/2023 Chest x-ray 11/12/2023 Plain films of the pelvis 11/12/2023 Plain films of the left femur 11/12/2023 Intramedullary nailing of the left hip per orthopedics: Dr. Carola Frost 11/13/2023   Antimicrobials:  Anti-infectives (From admission, onward)    None         Subjective: Patient sleeping but arousable.  Denies any chest pain or shortness of breath.  Denies any abdominal pain.  Denies any lower extremity pain.  States she ate some eggs and toast this morning for breakfast.  Objective: Vitals:   11/13/23 2212 11/14/23 0015 11/14/23 0414 11/14/23 0754  BP: 124/87 (!) 142/93 138/89 (!) 130/99  Pulse: 85 72 85 89  Resp:  18 19 16   Temp:  98.2 F (36.8 C) 97.6 F (36.4 C) 98 F (36.7 C)  TempSrc:  Oral Oral Oral  SpO2:  98% 100% 100%  Weight:      Height:        Intake/Output Summary (Last 24 hours) at 11/14/2023 1229  Last data filed at 11/13/2023 1314 Gross per 24 hour  Intake 700 ml  Output 75 ml  Net 625 ml   Filed Weights   11/12/23 0853 11/13/23 0838  Weight: 68.9 kg 68.9 kg    Examination:  General exam: NAD.  Dry mucous membranes.  Respiratory system: CTAB anterior lung fields.  No wheezes, no crackles, no rhonchi.   Normal respiratory effort.  Cardiovascular system: Irregularly irregular.  No JVD, no lower extremity edema.   Gastrointestinal system: Abdomen is soft, nontender, nondistended, positive bowel sounds.  No rebound.  No guarding.   Central nervous system: Alert and oriented. No focal neurological deficits. Extremities: Left lower extremity with postop dressing in place, nontender to palpation.   Skin: No rashes, lesions or ulcers Psychiatry: Judgement and insight unable to assess. Mood & affect unable to assess.     Data Reviewed: I have personally reviewed following labs and imaging studies  CBC: Recent Labs  Lab 11/12/23 0918 11/13/23 1726 11/14/23 0652  WBC 9.7 14.9* 12.6*  NEUTROABS 7.9*  --  9.9*  HGB 13.0 11.3* 11.2*  HCT 40.4 36.1 35.7*  MCV 89.4 92.6 92.2  PLT 258 243 215    Basic Metabolic Panel: Recent Labs  Lab 11/12/23 1010 11/12/23 1630 11/13/23 1726 11/14/23 0652  NA 140 138 142 144  K 2.9* 4.1 4.3 4.7  CL 93* 95* 97* 103  CO2 36* 36* 31 26  GLUCOSE 137* 151* 141* 115*  BUN 20 21 29* 33*  CREATININE 0.98 1.07* 1.34* 1.20*  CALCIUM 8.4* 8.2* 8.7* 8.6*  MG  --   --   --  2.1    GFR: Estimated Creatinine Clearance: 33.3 mL/min (A) (by C-G formula based on SCr of 1.2 mg/dL (H)).  Liver Function Tests: Recent Labs  Lab 11/12/23 1010  AST 30  ALT 25  ALKPHOS 56  BILITOT 0.8  PROT 6.7  ALBUMIN 2.7*    CBG: Recent Labs  Lab 11/13/23 1641 11/13/23 2031 11/14/23 1057  GLUCAP 144* 136* 111*     Recent Results (from the past 240 hours)  Surgical pcr screen     Status: Abnormal   Collection Time: 11/12/23  3:56 PM   Specimen: Nasal Mucosa; Nasal Swab  Result Value Ref Range Status   MRSA, PCR POSITIVE (A) NEGATIVE Final    Comment: RESULT CALLED TO, READ BACK BY AND VERIFIED WITH: RN L HUDSON [id number] AT 1352 BY CM    Staphylococcus aureus POSITIVE (A) NEGATIVE Final    Comment: (NOTE) The Xpert SA Assay (FDA approved for NASAL specimens in  patients 52 years of age and older), is one component of a comprehensive surveillance program. It is not intended to diagnose infection nor to guide or monitor treatment. Performed at [facility], [address] [place], [place] [address]          Radiology Studies: DG FEMUR PORT MIN 2 VIEWS LEFT Result Date: 11/13/2023 CLINICAL DATA:  Fracture, postop. EXAM: LEFT FEMUR PORTABLE 2 VIEWS COMPARISON:  Preoperative imaging. FINDINGS: Femoral intramedullary nail with trans trochanteric and distal locking screw fixation traverse proximal femur fracture. Improved fracture alignment from preoperative imaging. Recent postsurgical change includes air and edema in the soft tissues. IMPRESSION: ORIF proximal femur fracture. Electronically Signed   By: Narda Rutherford M.D.   On: 11/13/2023 15:06   DG FEMUR MIN 2 VIEWS LEFT Result Date: 11/13/2023 CLINICAL DATA:  Elective surgery. EXAM: LEFT FEMUR 2 VIEWS COMPARISON:  Preoperative imaging FINDINGS: Six  fluoroscopic spot views of the left femur obtained in the operating room. Femoral intramedullary nail with trans trochanteric and distal locking screw fixation traverse proximal femur fracture. Fluoroscopy time 83.2 seconds. Dose 23.62 mGy. IMPRESSION: Intraoperative fluoroscopy during proximal femur fracture fixation. Electronically Signed   By: Narda Rutherford M.D.   On: 11/13/2023 15:05   DG C-Arm 1-60 Min-No Report Result Date: 11/13/2023 Fluoroscopy was utilized by the requesting physician.  No radiographic interpretation.        Scheduled Meds:  acetaminophen  650 mg Oral Q4H   carvedilol  12.5 mg Oral BID   celecoxib  200 mg Oral BID   cholecalciferol  400 Units Oral Daily   cyanocobalamin  1,000 mcg Oral Daily   divalproex  125 mg Oral BID   docusate sodium  100 mg Oral BID   donepezil  10 mg Oral Daily   enoxaparin (LOVENOX) injection  40 mg Subcutaneous Q24H   fentaNYL (SUBLIMAZE) injection  25 mcg Intravenous Once    levETIRAcetam  500 mg Oral BID   LORazepam  0.25 mg Oral Q12H   risperiDONE  0.5 mg Oral QHS   sertraline  75 mg Oral Daily   sodium chloride flush  3 mL Intravenous Q12H   [START ON 11/15/2023] torsemide  80 mg Oral Daily   Continuous Infusions:     LOS: 2 days    Time spent: 35 minutes    Ramiro Harvest, MD [facility]   To contact the attending provider between 7A-7P or the covering provider during after hours 7P-7A, please log into the web site [email or web address] and access using universal [place] password for that web site. If you do not have the password, please call the hospital operator.  11/14/2023, 12:29 PM

## 2023-11-14 NOTE — TOC Progression Note (Addendum)
Transition of Care [facility]) - Progression Note    Patient Details  Name: Patricia Donaldson MRN: [id number] Date of Birth: 06/13/1941  [facility] CM/SW Contact  Lorri Frederick, LCSW Phone Number: 11/14/2023, 2:53 PM  Clinical Narrative:   CSW informed son is requesting return to [facility] memory care with hospice, rather than pursuing SNF.  CSW spoke with Shemika/[facility] [phone] and was informed that there is no nursing staff on the memory care who can monitor the pt incision and therefore they cannot accept her back without a SNF stay.  Team informed.     1515: CSW spoke with Angela/[facility].  Their RN can provide any needed incision care, she will have the rep call Shemika and go over this.  CSW spoke to [facility] again, discussed the above, Kirt Boys will again talk to her admin but appears to be issue with them simply not being willing to take so soon post op.  CSW asked her to ask how many days post op they are comfortable.  Discussed that they family does not want rehab for this pt, just comfort care.    1555: TC Shemika.  Per her admin, pt needs to be at her baseline or near it to return.  [place] was told pt could walk short distances and was 1 person assist when they admitted her, Kirt Boys doubts this was accurate and agrees she is likely wheelchair at baseline with 2 person transfer  Kirt Boys can come to the hospital on Tuesday to assess for a possible return.         Expected Discharge Plan and Services                                               Social Determinants of Health (SDOH) Interventions SDOH Screenings   Food Insecurity: No Food Insecurity (11/14/2023)  Housing: Patient Unable To Answer (11/14/2023)  Transportation Needs: No Transportation Needs (11/14/2023)  Utilities: Patient Unable To Answer (11/14/2023)  Financial Resource Strain: Low Risk  (10/16/2017)   Received from [facility], [facility]  Physical Activity: Unknown (10/16/2017)   Received from [facility], [facility]  Social Connections: Patient Unable To Answer (11/14/2023)  Stress: No Stress Concern Present (10/16/2017)   [facility] from [facility], [facility]  Tobacco Use: Medium Risk (11/13/2023)    Readmission Risk Interventions     No data to display

## 2023-11-14 NOTE — Progress Notes (Cosign Needed)
Orthopaedic Trauma Service Progress Note  Patient ID: Patricia Donaldson MRN: [id number] DOB/AGE: 1941/08/06 83 y.o.  Subjective:  Somnolent but comfortable appearing    ROS As above  Objective:   VITALS:   Vitals:   11/13/23 2212 11/14/23 0015 11/14/23 0414 11/14/23 0754  BP: 124/87 (!) 142/93 138/89 (!) 130/99  Pulse: 85 72 85 89  Resp:  18 19 16   Temp:  98.2 F (36.8 C) 97.6 F (36.4 C) 98 F (36.7 C)  TempSrc:  Oral Oral Oral  SpO2:  98% 100% 100%  Weight:      Height:        Estimated body mass index is 26.07 kg/m as calculated from the following:   Height as of this encounter: 5\' 4"  (1.626 m).   Weight as of this encounter: 68.9 kg.   Intake/Output      01/16 0701 01/17 0700 01/17 0701 01/18 0700   I.V. (mL/kg) 700 (10.2)    Total Intake(mL/kg) 700 (10.2)    Blood 75    Total Output 75    Net +625         Urine Occurrence 1 x      LABS  Results for orders placed or performed during the hospital encounter of 11/12/23 (from the past 24 hours)  Glucose, capillary     Status: Abnormal   Collection Time: 11/13/23  4:41 PM  Result Value Ref Range   Glucose-Capillary 144 (H) 70 - 99 mg/dL   Comment 1 Notify RN    Comment 2 Document in Chart   CBC     Status: Abnormal   Collection Time: 11/13/23  5:26 PM  Result Value Ref Range   WBC 14.9 (H) 4.0 - 10.5 K/uL   RBC 3.90 3.87 - 5.11 MIL/uL   Hemoglobin 11.3 (L) 12.0 - 15.0 g/dL   HCT 82.9 93.7 - 16.9 %   MCV 92.6 80.0 - 100.0 fL   MCH 29.0 26.0 - 34.0 pg   MCHC 31.3 30.0 - 36.0 g/dL   RDW 67.8 93.8 - 10.1 %   Platelets 243 150 - 400 K/uL   nRBC 0.0 0.0 - 0.2 %  Basic metabolic panel     Status: Abnormal   Collection Time: 11/13/23  5:26 PM  Result Value Ref Range   Sodium 142 135 - 145 mmol/L   Potassium 4.3 3.5 - 5.1 mmol/L   Chloride 97 (L) 98 - 111 mmol/L   CO2 31 22 - 32 mmol/L   Glucose, Bld 141 (H) 70 - 99 mg/dL    BUN 29 (H) 8 - 23 mg/dL   Creatinine, Ser 7.51 (H) 0.44 - 1.00 mg/dL   Calcium 8.7 (L) 8.9 - 10.3 mg/dL   GFR, Estimated 39 (L) >60 mL/min   Anion gap 14 5 - 15  Glucose, capillary     Status: Abnormal   Collection Time: 11/13/23  8:31 PM  Result Value Ref Range   Glucose-Capillary 136 (H) 70 - 99 mg/dL  CBC with Differential/Platelet     Status: Abnormal   Collection Time: 11/14/23  6:52 AM  Result Value Ref Range   WBC 12.6 (H) 4.0 - 10.5 K/uL   RBC 3.87 3.87 - 5.11 MIL/uL   Hemoglobin 11.2 (L) 12.0 - 15.0 g/dL   HCT 02.5 (L) 85.2 -  46.0 %   MCV 92.2 80.0 - 100.0 fL   MCH 28.9 26.0 - 34.0 pg   MCHC 31.4 30.0 - 36.0 g/dL   RDW 65.7 84.6 - 96.2 %   Platelets 215 150 - 400 K/uL   nRBC 0.0 0.0 - 0.2 %   Neutrophils Relative % 78 %   Neutro Abs 9.9 (H) 1.7 - 7.7 K/uL   Lymphocytes Relative 12 %   Lymphs Abs 1.5 0.7 - 4.0 K/uL   Monocytes Relative 9 %   Monocytes Absolute 1.1 (H) 0.1 - 1.0 K/uL   Eosinophils Relative 0 %   Eosinophils Absolute 0.0 0.0 - 0.5 K/uL   Basophils Relative 0 %   Basophils Absolute 0.0 0.0 - 0.1 K/uL   Immature Granulocytes 1 %   Abs Immature Granulocytes 0.10 (H) 0.00 - 0.07 K/uL  Basic metabolic panel     Status: Abnormal   Collection Time: 11/14/23  6:52 AM  Result Value Ref Range   Sodium 144 135 - 145 mmol/L   Potassium 4.7 3.5 - 5.1 mmol/L   Chloride 103 98 - 111 mmol/L   CO2 26 22 - 32 mmol/L   Glucose, Bld 115 (H) 70 - 99 mg/dL   BUN 33 (H) 8 - 23 mg/dL   Creatinine, Ser 9.52 (H) 0.44 - 1.00 mg/dL   Calcium 8.6 (L) 8.9 - 10.3 mg/dL   GFR, Estimated 45 (L) >60 mL/min   Anion gap 15 5 - 15  Magnesium     Status: None   Collection Time: 11/14/23  6:52 AM  Result Value Ref Range   Magnesium 2.1 1.7 - 2.4 mg/dL  VITAMIN D 25 Hydroxy (Vit-D Deficiency, Fractures)     Status: None   Collection Time: 11/14/23  6:52 AM  Result Value Ref Range   Vit D, 25-Hydroxy 50.86 30 - 100 ng/mL     PHYSICAL EXAM:   Gen: sitting upright in bed,  somnolent but comfortable  Ext:       Left Lower Extremity Dressing is clean, dry and intact  Extremity is warm  No DCT  Compartments are soft  No pain out of proportion with passive stretching of his toes or ankle  + DP pulse   Assessment/Plan: 1 Day Post-Op   Principal Problem:   Fall at home, initial encounter Active Problems:   Hyperlipidemia   Hypertension   Atrial fibrillation, chronic (HCC)   Femoral fracture (HCC)   Compression fracture of body of thoracic vertebra (HCC)   Seizure (HCC)   Fall   Dementia with behavioral disturbance (HCC)   Anxiety   Anti-infectives (From admission, onward)    None     .  POD/HD#: 1  83 y/o female s/p fall at memory care unit with L hip fracture s/p IMN L hip     Weightbearing: WBAT LLE ROM: no motion restrictions Insicional and dressing care: Daily dressing changes with mepilex starting on 11/16/2023 Pain management: pt under hospice/palliative care at her facility  VTE prophylaxis: lovenox while inpatient   Dispo: ortho issues addressing.  IMN L hip is a palliative measure    Follow - up plan: 2 weeks with ortho for suture removal and xrays    Mearl Latin, PA-C [phone] (C) 11/14/2023, 9:45 AM  [facility] [address] [place] [address] [phone] Val [phone] (F)    After 5pm and on the weekends please log on to Amion, go to orthopaedics and the look under the [facility] Call for the provider(s) on call. You can also call our office at [phone] and then follow the prompts to be connected to the call team.  Patient ID: Patricia Donaldson, female   DOB: [birth year], 83 y.o.   MRN: [id number]

## 2023-11-14 NOTE — Progress Notes (Signed)
PT Cancellation Note  Patient Details Name: Patricia Donaldson MRN: [id number] DOB: 1940-11-20   Cancelled Treatment:    Reason Eval/Treat Not Completed: Other (comment) (Pt is currently comfort measures only. Will defer PT eval at this time. Please re-consult if any changes to POC.)  Hilton Cork, PT, DPT Secure Chat Preferred  Rehab Office [phone]   Arturo Morton Brion Aliment 11/14/2023, 9:44 AM

## 2023-11-14 NOTE — Discharge Instructions (Signed)
Orthopaedic Trauma Service Discharge Instructions   General Discharge Instructions  Orthopaedic Injuries:  Left hip fracture treated with intramedullary nailing   WEIGHT BEARING STATUS: Weightbearing as tolerated using walker   RANGE OF MOTION/ACTIVITY: activity as tolerated, no range of motion restrictions at hip or knee   Bone health:  vitamin d levels look good but fracture is associated with osteoporosis   Review the following resource for additional information regarding bone health  [email or web address].cy  Wound Care: daily wound care as needed, see below    Discharge Wound Care Instructions  Do NOT apply any ointments, solutions or lotions to pin sites or surgical wounds.  These prevent needed drainage and even though solutions like hydrogen peroxide kill bacteria, they also damage cells lining the pin sites that help fight infection.  Applying lotions or ointments can keep the wounds moist and can cause them to breakdown and open up as well. This can increase the risk for infection. When in doubt call the office.  Surgical incisions should be dressed daily.  If any drainage is noted, use one layer of adaptic or Mepitel, then gauze and tape. Alternatively you can use a silicone foam dressing such as a mepilex dressing (this is what you have on)  NetCamper.cz [email or web address]://[email or web address]-soto.com/?pd_rd_i=[email or web address]&th=1  [email or web address].com/  These dressing supplies should be available at local medical supply stores ([facility] [employer], [facility] [employer], etc). They are not usually carried at places like [facility], [facility], [facility], etc  Once the incision is completely dry and without drainage, it may be left open to air out.  Showering may begin 36-48 hours later.  Cleaning gently with  soap and water.   Diet: as you were eating previously.  Can use over the counter stool softeners and bowel preparations, such as Miralax, to help with bowel movements.  Narcotics can be constipating.  Be sure to drink plenty of fluids  PAIN MEDICATION USE AND EXPECTATIONS  You have likely been given narcotic medications to help control your pain.  After a traumatic event that results in an fracture (broken bone) with or without surgery, it is ok to use narcotic pain medications to help control one's pain.  We understand that everyone responds to pain differently and each individual patient will be evaluated on a regular basis for the continued need for narcotic medications. Ideally, narcotic medication use should last no more than 6-8 weeks (coinciding with fracture healing).   As a patient it is your responsibility as well to monitor narcotic medication use and report the amount and frequency you use these medications when you come to your office visit.   We would also advise that if you are using narcotic medications, you should take a dose prior to therapy to maximize you participation.  IF YOU ARE ON NARCOTIC MEDICATIONS IT IS NOT PERMISSIBLE TO OPERATE A MOTOR VEHICLE (MOTORCYCLE/CAR/TRUCK/MOPED) OR HEAVY MACHINERY DO NOT MIX NARCOTICS WITH OTHER CNS (CENTRAL NERVOUS SYSTEM) DEPRESSANTS SUCH AS ALCOHOL   POST-OPERATIVE OPIOID TAPER INSTRUCTIONS: It is important to wean off of your opioid medication as soon as possible. If you do not need pain medication after your surgery it is ok to stop day one. Opioids include: Codeine, Hydrocodone(Norco, Vicodin), Oxycodone(Percocet, oxycontin) and hydromorphone amongst others.  Long term and even short term use of opiods can cause: Increased pain response Dependence Constipation Depression Respiratory depression And more.  Withdrawal symptoms can include Flu like symptoms Nausea, vomiting And more Techniques to manage these symptoms Hydrate  well Eat regular healthy meals Stay active Use relaxation  techniques(deep breathing, meditating, yoga) Do Not substitute Alcohol to help with tapering If you have been on opioids for less than two weeks and do not have pain than it is ok to stop all together.  Plan to wean off of opioids This plan should start within one week post op of your fracture surgery  Maintain the same interval or time between taking each dose and first decrease the dose.  Cut the total daily intake of opioids by one tablet each day Next start to increase the time between doses. The last dose that should be eliminated is the evening dose.    STOP SMOKING OR USING NICOTINE PRODUCTS!!!!  As discussed nicotine severely impairs your body's ability to heal surgical and traumatic wounds but also impairs bone healing.  Wounds and bone heal by forming microscopic blood vessels (angiogenesis) and nicotine is a vasoconstrictor (essentially, shrinks blood vessels).  Therefore, if vasoconstriction occurs to these microscopic blood vessels they essentially disappear and are unable to deliver necessary nutrients to the healing tissue.  This is one modifiable factor that you can do to dramatically increase your chances of healing your injury.    (This means no smoking, no nicotine gum, patches, etc)  DO NOT USE NONSTEROIDAL ANTI-INFLAMMATORY DRUGS (NSAID'S)  Using products such as Advil (ibuprofen), Aleve (naproxen), Motrin (ibuprofen) for additional pain control during fracture healing can delay and/or prevent the healing response.  If you would like to take over the counter (OTC) medication, Tylenol (acetaminophen) is ok.  However, some narcotic medications that are given for pain control contain acetaminophen as well. Therefore, you should not exceed more than 4000 mg of tylenol in a day if you do not have liver disease.  Also note that there are may OTC medicines, such as cold medicines and allergy medicines that my contain tylenol  as well.  If you have any questions about medications and/or interactions please ask your doctor/PA or your pharmacist.      ICE AND ELEVATE INJURED/OPERATIVE EXTREMITY  Using ice and elevating the injured extremity above your heart can help with swelling and pain control.  Icing in a pulsatile fashion, such as 20 minutes on and 20 minutes off, can be followed.    Do not place ice directly on skin. Make sure there is a barrier between to skin and the ice pack.    Using frozen items such as frozen peas works well as the conform nicely to the are that needs to be iced.  USE AN ACE WRAP OR TED HOSE FOR SWELLING CONTROL  In addition to icing and elevation, Ace wraps or TED hose are used to help limit and resolve swelling.  It is recommended to use Ace wraps or TED hose until you are informed to stop.    When using Ace Wraps start the wrapping distally (farthest away from the body) and wrap proximally (closer to the body)   Example: If you had surgery on your leg or thing and you do not have a splint on, start the ace wrap at the toes and work your way up to the thigh        If you had surgery on your upper extremity and do not have a splint on, start the ace wrap at your fingers and work your way up to the upper arm  IF YOU ARE IN A SPLINT OR CAST DO NOT REMOVE IT FOR ANY REASON   If your splint gets wet for any reason please contact the office  immediately. You may shower in your splint or cast as long as you keep it dry.  This can be done by wrapping in a cast cover or garbage back (or similar)  Do Not stick any thing down your splint or cast such as pencils, money, or hangers to try and scratch yourself with.  If you feel itchy take benadryl as prescribed on the bottle for itching  IF YOU ARE IN A CAM BOOT (BLACK BOOT)  You may remove boot periodically. Perform daily dressing changes as noted below.  Wash the liner of the boot regularly and wear a sock when wearing the boot. It is recommended that  you sleep in the boot until told otherwise    Call office for the following: Temperature greater than 101F Persistent nausea and vomiting Severe uncontrolled pain Redness, tenderness, or signs of infection (pain, swelling, redness, odor or green/yellow discharge around the site) Difficulty breathing, headache or visual disturbances Hives Persistent dizziness or light-headedness Extreme fatigue Any other questions or concerns you may have after discharge  In an emergency, call 911 or go to an Emergency Department at a nearby hospital  HELPFUL INFORMATION  If you had a block, it will wear off between 8-24 hrs postop typically.  This is period when your pain may go from nearly zero to the pain you would have had postop without the block.  This is an abrupt transition but nothing dangerous is happening.  You may take an extra dose of narcotic when this happens.  You should wean off your narcotic medicines as soon as you are able.  Most patients will be off or using minimal narcotics before their first postop appointment.   We suggest you use the pain medication the first night prior to going to bed, in order to ease any pain when the anesthesia wears off. You should avoid taking pain medications on an empty stomach as it will make you nauseous.  Do not drink alcoholic beverages or take illicit drugs when taking pain medications.  In most states it is against the law to drive while you are in a splint or sling.  And certainly against the law to drive while taking narcotics.  You may return to work/school in the next couple of days when you feel up to it.   Pain medication may make you constipated.  Below are a few solutions to try in this order: Decrease the amount of pain medication if you aren't having pain. Drink lots of decaffeinated fluids. Drink prune juice and/or each dried prunes  If the first 3 don't work start with additional solutions Take Colace - an over-the-counter stool  softener Take Senokot - an over-the-counter laxative Take Miralax - a stronger over-the-counter laxative     CALL THE OFFICE WITH ANY QUESTIONS OR CONCERNS: [phone]   VISIT OUR WEBSITE FOR ADDITIONAL INFORMATION: [email or web address]

## 2023-11-14 NOTE — Plan of Care (Signed)
  Problem: Education: Goal: Knowledge of General Education information will improve Description Including pain rating scale, medication(s)/side effects and non-pharmacologic comfort measures Outcome: Progressing   

## 2023-11-14 NOTE — Anesthesia Postprocedure Evaluation (Signed)
Anesthesia Post Note  Patient: Patricia Donaldson  Procedure(s) Performed: INTRAMEDULLARY (IM) NAIL INTERTROCHANTERIC (Left)     Patient location during evaluation: PACU Anesthesia Type: General Level of consciousness: awake and alert Pain management: pain level controlled Vital Signs Assessment: post-procedure vital signs reviewed and stable Respiratory status: spontaneous breathing, nonlabored ventilation, respiratory function stable and patient connected to nasal cannula oxygen Cardiovascular status: blood pressure returned to baseline and stable Postop Assessment: no apparent nausea or vomiting Anesthetic complications: no   No notable events documented.               Shelton Silvas

## 2023-11-14 NOTE — Consult Note (Signed)
Consultation Note Date: 11/14/2023   Patient Name: Patricia Donaldson  DOB: 1941-07-15  MRN: [id number]  Age / Sex: 83 y.o., female  PCP: Smiley Houseman, NP Referring Physician: Rodolph Bong, MD  Reason for Consultation: Establishing goals of care  HPI/Patient Profile: 83 y.o. female  with past medical history of dementia, HLD, HTN, A-fib, seizure, and anxiety admitted on 11/12/2023 with a fall at her facility and found to have femur fracture.  Patient was on hospice services prior to admission at her memory care unit.  PMT consulted to discuss goals of care.  Clinical Assessment and Goals of Care: I have reviewed medical records including EPIC notes, labs and imaging, assessed the patient and then spoke with son Madelaine Bhat to discuss diagnosis prognosis, GOC, EOL wishes, disposition and options.  Upon assessment of patient she wakes easily to voice.  She tells me her eyes are matted shut which I cleaned for her.  She denies any pain or any concerns.  I offered her her breakfast tray that was at her bedside but she declines this.  She quickly goes back to sleep after a brief interaction.  Appears comfortable.  I introduced Palliative Medicine as specialized medical care for people living with serious illness. It focuses on providing relief from the symptoms and stress of a serious illness. The goal is to improve quality of life for both the patient and the family.  Adam tells me patient has lived in some sort of memory care facility for several years but just moved to her current one, [place], less than 1 week ago.  He tells me she has had hospice services for few months.  He tells me she has really declined over the past few months with very limited mobility.  Would occasionally use a walker but mostly wheelchair-bound.  He tells me of decreased appetite and very limited verbal interaction.   We discussed patient's current illness and what it means in  the larger context of patient's on-going co-morbidities.    I attempted to elicit values and goals of care important to the patient.  Madelaine Bhat is clear that the goal of his mother's care should be her comfort and he would like to avoid all aggressive medical care.  He would like hospice to remain involved in her care.  We discussed option of her returning back to her ALF with hospice if they would take her.  He is interested in exploring this option.  Discussed with patient's hospice liaison Marylene Land who is with [place] [facility] who felt they could provide the care patient needs at her return to ALF.  Discussed all of the above with TOC team.  Unfortunately at this time it is unclear if patient's ALF will receive her back.  Per son her current status is pretty close to her baseline.  Per TOC, representative from patient's ALF will come and see her next week and decide if they will take her back.  Questions and concerns were addressed. The family was encouraged to call with questions or concerns.  Primary Decision Maker NEXT OF KIN    SUMMARY OF RECOMMENDATIONS   -All care should be aimed at patient's comfort -Son would like hospice to remain involved in patient's care to promote comfort -Son is hopeful patient can be discharged to her ALF with hospice to provide care there but is unclear if this can be worked out at this time, [facility] working on this  Code Status/Advance Care Planning: DNR   Symptom Management:  PRN morphine concentrate  Discharge Planning: To Be Determined      Primary Diagnoses: Present on Admission:  Fall  Hyperlipidemia  Hypertension  Atrial fibrillation, chronic (HCC)   I have reviewed the medical record, interviewed the patient and family, and examined the patient. The following aspects are pertinent.  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   Asthma    Atrial fibrillation (HCC)    Circulation  problem    Heart disease    Hypertension    TIA (transient ischemic attack)    2007   Tinnitus    Social History   Socioeconomic History   Marital status: Widowed    Spouse name: Not on file   Number of children: Not on file   Years of education: Not on file   Highest education level: Not on file  Occupational History   Not on file  Tobacco Use   Smoking status: Former    Current packs/day: 0.00    Types: Cigarettes    Quit date: 08/31/1959    Years since quitting: 64.2   Smokeless tobacco: Never  Vaping Use   Vaping status: Never Used  Substance and Sexual Activity   Alcohol use: No   Drug use: Not on file   Sexual activity: Never  Other Topics Concern   Not on file  Social History Narrative   Not on file   Social Drivers of Health   Financial Resource Strain: Low Risk  (10/16/2017)   Received from [facility], [facility]   Overall Financial Resource Strain (CARDIA)    Difficulty of Paying Living Expenses: Not very hard  Food Insecurity: No Food Insecurity (10/16/2017)   Received from [facility], [facility]   Hunger Vital Sign    Worried About Running Out of Food in the Last Year: Never true    Ran Out of Food in the Last Year: Never true  Transportation Needs: No Transportation Needs (10/16/2017)   Received from [facility], [facility]   PRAPARE - Transportation    Lack of Transportation (Medical): No    Lack of Transportation (Non-Medical): No  Physical Activity: Unknown (10/16/2017)   Received from [facility], [facility]   Exercise Vital Sign    Days of Exercise per Week: 0 days    Minutes of Exercise per Session: Not on file  Stress: No Stress Concern Present (10/16/2017)   Received from [facility], [facility]   [employer] of Occupational Health - Occupational  Stress Questionnaire    Feeling of Stress : Only a little  Social Connections: Moderately Integrated (10/16/2017)   Received from [facility], [facility]   Social Connection and Isolation Panel [NHANES]    Frequency of Communication with Friends and Family: More than three times a week    Frequency of Social Gatherings with Friends and Family: More than three times a week    Attends Religious Services: More than 4 times per year    Active Member of [employer] or Organizations: Yes    Attends Banker Meetings: More than 4 times per year    Marital Status: Widowed   Family History  Problem Relation Age of Onset   Cancer Mother  stomach   Breast cancer Maternal Grandmother    Kidney disease Neg Hx    Bladder Cancer Neg Hx    Scheduled Meds:  acetaminophen  650 mg Oral Q4H   carvedilol  12.5 mg Oral BID   celecoxib  200 mg Oral BID   cholecalciferol  400 Units Oral Daily   cyanocobalamin  1,000 mcg Oral Daily   divalproex  125 mg Oral BID   docusate sodium  100 mg Oral BID   donepezil  10 mg Oral Daily   enoxaparin (LOVENOX) injection  40 mg Subcutaneous Q24H   fentaNYL (SUBLIMAZE) injection  25 mcg Intravenous Once   levETIRAcetam  500 mg Oral BID   LORazepam  0.25 mg Oral Q12H   risperiDONE  0.5 mg Oral QHS   sertraline  75 mg Oral Daily   sodium chloride flush  3 mL Intravenous Q12H   [START ON 11/15/2023] torsemide  80 mg Oral Daily   Continuous Infusions: PRN Meds:.albuterol, menthol-cetylpyridinium **OR** phenol, metoCLOPramide **OR** metoCLOPramide (REGLAN) injection, morphine CONCENTRATE, ondansetron, polyethylene glycol Allergies  Allergen Reactions   Erythromycin Hives and Rash   Aspirin    Other Other (See Comments)    Medication which has discolored lower extremity/feet.   Penicillins Hives   Lobster [Shellfish Allergy] Hives and Rash   Review of Systems  Unable to perform ROS: Dementia    Physical  Exam Constitutional:      General: She is not in acute distress.    Appearance: She is ill-appearing.     Comments: Wakes easily to voice but quickly goes back to sleep  Pulmonary:     Effort: Pulmonary effort is normal.  Skin:    General: Skin is warm and dry.  Neurological:     Mental Status: She is disoriented.     Vital Signs: BP (!) 130/99 (BP Location: Right Arm)   Pulse 89   Temp 98 F (36.7 C) (Oral)   Resp 16   Ht 5\' 4"  (1.626 m)   Wt 68.9 kg   SpO2 100%   BMI 26.07 kg/m  Pain Scale: PAINAD   Pain Score: 0-No pain   SpO2: SpO2: 100 % O2 Device:SpO2: 100 % O2 Flow Rate: .O2 Flow Rate (L/min): 3 L/min  IO: Intake/output summary:  Intake/Output Summary (Last 24 hours) at 11/14/2023 1231 Last data filed at 11/13/2023 1314 Gross per 24 hour  Intake 700 ml  Output 75 ml  Net 625 ml    LBM:   Baseline Weight: Weight: 68.9 kg Most recent weight: Weight: 68.9 kg     Palliative Assessment/Data: PPS 20%     *Please note that this is a verbal dictation therefore any spelling or grammatical errors are due to the "Dragon Medical One" system interpretation.   Time Total: 60 minutes Time spent includes: Detailed review of medical records (labs, imaging, vital signs), medically appropriate exam, discussion with treatment team, counseling and educating patient, family and/or staff, documenting clinical information, medication management and coordination of care.    Gerlean Ren, DNP, AGNP-C Palliative Medicine Team [phone] Pager: [phone]

## 2023-11-14 NOTE — Progress Notes (Signed)
SLP Cancellation Note  Patient Details Name: Patricia Donaldson MRN: [id number] DOB: [birth year]   Cancelled treatment:       Reason Eval/Treat Not Completed: Other (comment). Given plan for comfort measures only will defer swallow eval at this time. Please reach out if SLP assistance is needed   Kodey Xue, Sidonia Knake 11/14/2023, 8:36 AM

## 2023-11-15 DIAGNOSIS — S72352P Displaced comminuted fracture of shaft of left femur, subsequent encounter for closed fracture with malunion: Secondary | ICD-10-CM | POA: Diagnosis not present

## 2023-11-15 DIAGNOSIS — F03918 Unspecified dementia, unspecified severity, with other behavioral disturbance: Secondary | ICD-10-CM | POA: Diagnosis not present

## 2023-11-15 DIAGNOSIS — W19XXXA Unspecified fall, initial encounter: Secondary | ICD-10-CM | POA: Diagnosis not present

## 2023-11-15 DIAGNOSIS — I10 Essential (primary) hypertension: Secondary | ICD-10-CM | POA: Diagnosis not present

## 2023-11-15 LAB — BASIC METABOLIC PANEL
Anion gap: 9 (ref 5–15)
BUN: 34 mg/dL — ABNORMAL HIGH (ref 8–23)
CO2: 33 mmol/L — ABNORMAL HIGH (ref 22–32)
Calcium: 8.6 mg/dL — ABNORMAL LOW (ref 8.9–10.3)
Chloride: 103 mmol/L (ref 98–111)
Creatinine, Ser: 1.01 mg/dL — ABNORMAL HIGH (ref 0.44–1.00)
GFR, Estimated: 55 mL/min — ABNORMAL LOW (ref >60)
Glucose, Bld: 107 mg/dL — ABNORMAL HIGH (ref 70–99)
Potassium: 4.4 mmol/L (ref 3.5–5.1)
Sodium: 145 mmol/L (ref 135–145)

## 2023-11-15 LAB — CBC
HCT: 28.3 % — ABNORMAL LOW (ref 36.0–46.0)
Hemoglobin: 8.8 g/dL — ABNORMAL LOW (ref 12.0–15.0)
MCH: 29 pg (ref 26.0–34.0)
MCHC: 31.1 g/dL (ref 30.0–36.0)
MCV: 93.4 fL (ref 80.0–100.0)
Platelets: 212 10*3/uL (ref 150–400)
RBC: 3.03 MIL/uL — ABNORMAL LOW (ref 3.87–5.11)
RDW: 13.9 % (ref 11.5–15.5)
WBC: 9.5 10*3/uL (ref 4.0–10.5)
nRBC: 0 % (ref 0.0–0.2)

## 2023-11-15 LAB — URINE CULTURE: Culture: NO GROWTH

## 2023-11-15 NOTE — Progress Notes (Signed)
PROGRESS NOTE    Patricia Donaldson  GLO:[id number] DOB: 1941-02-20 DOA: 11/12/2023 PCP: Smiley Houseman, NP    Chief Complaint  Patient presents with   Fall   Hip Pain    Brief Narrative:  Patient is a 83 year old female history of dementia and memory care unit at a nursing home and presented to the ED with an unwitnessed fall with inability to get up.  Imaging done concerning for left femoral fracture.  Orthopedics consulted and patient for IM nail today.  Admitting physician discussed with patient's son, Kalei Hawksley over the telephone who clearly advised patient to receive only palliative/comfort measures.  Patient would not want any treatment with curative intent unless comfort factors overwhelming.   Assessment & Plan:   Principal Problem:   Fall at home, initial encounter Active Problems:   Femoral fracture (HCC)   Hyperlipidemia   Hypertension   Atrial fibrillation, chronic (HCC)   Compression fracture of body of thoracic vertebra (HCC)   Seizure (HCC)   Fall   Dementia with behavioral disturbance (HCC)   Anxiety  1 acute left commuted intertrochanteric fracture of proximal left femur with varus angulation -Secondary to mechanical fall. -Patient seen in consultation by orthopedics and patient status post IM nail of left hip per orthopedics: Dr. Carola Frost 11/13/2023. -IM nail placed for palliation. -Continue current pain management. -Will need outpatient follow-up with orthopedics.  2.  Seizure disorder -Stable. -Continue home regimen Depakote sprinkles and Keppra.  3.  Compression fracture of the body of the thoracic vertebrae -Likely chronic. -Continue current pain management.  4.  Hypertension -BP soft and as such discontinue Coreg.   5.  Dementia -Continue home regimen risperidone, Depakote sprinkles, Aricept.   6.  Hyperlipidemia -Continue to hold statin and may consider discontinuation on discharge at this is noted that patient was being followed in the  outpatient setting by hospice.  7.  Anxiety -Zoloft, Ativan.   8.  Chronic atrial fibrillation -Continue Coreg for rate control. -Patient not on anticoagulation prior to admission due to history of hemorrhagic stroke on anticoagulation per outpatient neurology note on 02/19/2022.  9.  Dehydration -Patient with dry mucous membranes on exam, looks dehydrated on examination. -Gentle hydration.   -Discontinued diuretics.     DVT prophylaxis: Per orthopedics Code Status: Full Family Communication: No family at bedside.   Disposition: Likely back to facility with hospice following.  Status is: Inpatient Remains inpatient appropriate because: Severity of illness   Consultants:  Orthopedics: Earney Hamburg, PA 11/12/2023  Procedures:  CT head CT C-spine 11/12/2023 Chest x-ray 11/12/2023 Plain films of the pelvis 11/12/2023 Plain films of the left femur 11/12/2023 Intramedullary nailing of the left hip per orthopedics: Dr. Carola Frost 11/13/2023   Antimicrobials:  Anti-infectives (From admission, onward)    None         Subjective: Patient sleeping but arousable however keeps eyes closed.  Denies any chest pain no shortness of breath.  No abdominal pain.  Denies any lower extremity pain.  Denies any lightheadedness or dizziness.    Objective: Vitals:   11/15/23 0410 11/15/23 0416 11/15/23 0734 11/15/23 1056  BP: (!) 88/62 96/69 (!) 87/68 98/62  Pulse: 74 73 80   Resp: 16 18 18    Temp: 98.3 F (36.8 C) 97.7 F (36.5 C)    TempSrc:  Oral    SpO2: 95% 99% 94%   Weight:      Height:        Intake/Output Summary (Last 24 hours) at 11/15/2023  1254 Last data filed at 11/15/2023 0630 Gross per 24 hour  Intake --  Output 600 ml  Net -600 ml   Filed Weights   11/12/23 0853 11/13/23 0838  Weight: 68.9 kg 68.9 kg    Examination:  General exam: NAD.  Dry mucous membranes. Respiratory system: Lungs clear to auscultation bilaterally anterior lung fields.  No wheezing, no  crackles, no rhonchi.  Cardiovascular system: Irregularly irregular.  No JVD, no lower extremity edema.  Gastrointestinal system: Abdomen is soft, nontender, nondistended, positive bowel sounds.  No rebound.  No guarding.  Central nervous system: Alert and oriented. No focal neurological deficits. Extremities: Left lower extremity with postop dressing in place, nontender to palpation.   Skin: No rashes, lesions or ulcers Psychiatry: Judgement and insight unable to assess. Mood & affect unable to assess.     Data Reviewed: I have personally reviewed following labs and imaging studies  CBC: Recent Labs  Lab 11/12/23 0918 11/13/23 1726 11/14/23 0652 11/15/23 0504  WBC 9.7 14.9* 12.6* 9.5  NEUTROABS 7.9*  --  9.9*  --   HGB 13.0 11.3* 11.2* 8.8*  HCT 40.4 36.1 35.7* 28.3*  MCV 89.4 92.6 92.2 93.4  PLT 258 243 215 212    Basic Metabolic Panel: Recent Labs  Lab 11/12/23 1010 11/12/23 1630 11/13/23 1726 11/14/23 0652 11/15/23 0504  NA 140 138 142 144 145  K 2.9* 4.1 4.3 4.7 4.4  CL 93* 95* 97* 103 103  CO2 36* 36* 31 26 33*  GLUCOSE 137* 151* 141* 115* 107*  BUN 20 21 29* 33* 34*  CREATININE 0.98 1.07* 1.34* 1.20* 1.01*  CALCIUM 8.4* 8.2* 8.7* 8.6* 8.6*  MG  --   --   --  2.1  --     GFR: Estimated Creatinine Clearance: 39.5 mL/min (A) (by C-G formula based on SCr of 1.01 mg/dL (H)).  Liver Function Tests: Recent Labs  Lab 11/12/23 1010  AST 30  ALT 25  ALKPHOS 56  BILITOT 0.8  PROT 6.7  ALBUMIN 2.7*    CBG: Recent Labs  Lab 11/13/23 1641 11/13/23 2031 11/14/23 1057 11/14/23 1241 11/14/23 1629  GLUCAP 144* 136* 111* 130* 97     Recent Results (from the past 240 hours)  Surgical pcr screen     Status: Abnormal   Collection Time: 11/12/23  3:56 PM   Specimen: Nasal Mucosa; Nasal Swab  Result Value Ref Range Status   MRSA, PCR POSITIVE (A) NEGATIVE Final    Comment: RESULT CALLED TO, READ BACK BY AND VERIFIED WITH: RN L HUDSON [id number] AT 1352 BY  CM    Staphylococcus aureus POSITIVE (A) NEGATIVE Final    Comment: (NOTE) The Xpert SA Assay (FDA approved for NASAL specimens in patients 25 years of age and older), is one component of a comprehensive surveillance program. It is not intended to diagnose infection nor to guide or monitor treatment. Performed at [facility], [address], [place], [place] [address]          Radiology Studies: DG FEMUR PORT MIN 2 VIEWS LEFT Result Date: 11/13/2023 CLINICAL DATA:  Fracture, postop. EXAM: LEFT FEMUR PORTABLE 2 VIEWS COMPARISON:  Preoperative imaging. FINDINGS: Femoral intramedullary nail with trans trochanteric and distal locking screw fixation traverse proximal femur fracture. Improved fracture alignment from preoperative imaging. Recent postsurgical change includes air and edema in the soft tissues. IMPRESSION: ORIF proximal femur fracture. Electronically Signed   By: Narda Rutherford M.D.   On: 11/13/2023 15:06  Scheduled Meds:  acetaminophen  650 mg Oral Q4H   cholecalciferol  400 Units Oral Daily   cyanocobalamin  1,000 mcg Oral Daily   divalproex  125 mg Oral BID   docusate sodium  100 mg Oral BID   donepezil  10 mg Oral Daily   enoxaparin (LOVENOX) injection  40 mg Subcutaneous Q24H   fentaNYL (SUBLIMAZE) injection  25 mcg Intravenous Once   levETIRAcetam  500 mg Oral BID   LORazepam  0.25 mg Oral Q12H   risperiDONE  0.5 mg Oral QHS   sertraline  75 mg Oral Daily   sodium chloride flush  3 mL Intravenous Q12H   Continuous Infusions:  lactated ringers 75 mL/hr at 11/15/23 1139      LOS: 3 days    Time spent: 35 minutes    Ramiro Harvest, MD [facility]   To contact the attending provider between 7A-7P or the covering provider during after hours 7P-7A, please log into the web site [email or web address] and access using universal [place] password for that web site. If you do not have the password, please call the hospital  operator.  11/15/2023, 12:54 PM

## 2023-11-15 NOTE — Plan of Care (Signed)

## 2023-11-15 NOTE — Progress Notes (Signed)
Patient woke up for a few minutes when staff was performing q2 turns. Patient's son, Patricia Donaldson, was at bedside and spoke with his mom briefly. Patient nodded "yes" when asked if she wanted water. She drank of water at that time, then went back to sleep. Patricia Donaldson reminded this RN that he and his brother do not want CPR or a ventilator for their mother if her heart stops. RN acknowledged this and educated him regarding her DNR band on her wrist as well as the code status in her chart.

## 2023-11-15 NOTE — Progress Notes (Signed)
     Subjective:  Patient lying comfortably in bed. Minimally responsive per nursing. Woke up just long enough to take meds yesterday. She is comfort care measures. PT/OT were deferred.  Objective:   VITALS:   Vitals:   11/14/23 2206 11/15/23 0410 11/15/23 0416 11/15/23 0734  BP: 116/68 (!) 88/62 96/69 (!) 87/68  Pulse: 79 74 73 80  Resp:  16 18 18   Temp:  98.3 F (36.8 C) 97.7 F (36.5 C)   TempSrc:   Oral   SpO2:  95% 99% 94%  Weight:      Height:        Intact pulses distally Incision: dressing C/D/I    Lab Results  Component Value Date   WBC 9.5 11/15/2023   HGB 8.8 (L) 11/15/2023   HCT 28.3 (L) 11/15/2023   MCV 93.4 11/15/2023   PLT 212 11/15/2023   BMET    Component Value Date/Time   NA 145 11/15/2023 0504   NA 141 05/30/2014 1135   K 4.4 11/15/2023 0504   K 3.6 05/30/2014 1135   CL 103 11/15/2023 0504   CL 105 05/30/2014 1135   CO2 33 (H) 11/15/2023 0504   CO2 29 05/30/2014 1135   GLUCOSE 107 (H) 11/15/2023 0504   GLUCOSE 93 05/30/2014 1135   BUN 34 (H) 11/15/2023 0504   BUN 14 05/30/2014 1135   CREATININE 1.01 (H) 11/15/2023 0504   CREATININE 0.54 (L) 05/30/2014 1135   CALCIUM 8.6 (L) 11/15/2023 0504   CALCIUM 8.8 05/30/2014 1135   GFRNONAA 55 (L) 11/15/2023 0504   GFRNONAA >60 05/30/2014 1135    Assessment/Plan: 2 Days Post-Op   Principal Problem:   Fall at home, initial encounter Active Problems:   Hyperlipidemia   Hypertension   Atrial fibrillation, chronic (HCC)   Femoral fracture (HCC)   Compression fracture of body of thoracic vertebra (HCC)   Seizure (HCC)   Fall   Dementia with behavioral disturbance (HCC)   Anxiety  POD/HD#: 2   83 y/o female s/p fall at memory care unit with L hip fracture s/p IMN L hip      Weightbearing: WBAT LLE ROM: no motion restrictions Insicional and dressing care: Daily dressing changes with mepilex starting on 11/16/2023 Pain management: pt under hospice/palliative care at her facility    VTE prophylaxis: lovenox while inpatient    Dispo: ortho issues addressing.  IMN L hip is a palliative measure      Follow - up plan: 2 weeks with ortho for suture removal and xrays       Wynne Jury A Shaylyn Bawa 11/15/2023, 7:39 AM   Weber Cooks, MD  Contact information:   [id number] 7am-5pm epic message Dr. Blanchie Dessert, or call office for patient follow up: [phone] After hours and holidays please check [email or web address] for group call information for [facility]

## 2023-11-16 DIAGNOSIS — W19XXXA Unspecified fall, initial encounter: Secondary | ICD-10-CM | POA: Diagnosis not present

## 2023-11-16 DIAGNOSIS — I10 Essential (primary) hypertension: Secondary | ICD-10-CM | POA: Diagnosis not present

## 2023-11-16 DIAGNOSIS — F03918 Unspecified dementia, unspecified severity, with other behavioral disturbance: Secondary | ICD-10-CM | POA: Diagnosis not present

## 2023-11-16 DIAGNOSIS — Y92009 Unspecified place in unspecified non-institutional (private) residence as the place of occurrence of the external cause: Secondary | ICD-10-CM | POA: Diagnosis not present

## 2023-11-16 DIAGNOSIS — Z515 Encounter for palliative care: Secondary | ICD-10-CM | POA: Diagnosis not present

## 2023-11-16 DIAGNOSIS — S72352P Displaced comminuted fracture of shaft of left femur, subsequent encounter for closed fracture with malunion: Secondary | ICD-10-CM | POA: Diagnosis not present

## 2023-11-16 NOTE — Progress Notes (Addendum)
Daily Progress Note   Patient Name: Patricia Donaldson       Date: 11/16/2023 DOB: 1940/12/31  Age: 83 y.o. MRN#: [id number] Attending Physician: Rodolph Bong, MD Primary Care Physician: Smiley Houseman, NP Admit Date: 11/12/2023  Reason for Consultation/Follow-up: Establishing goals of care   Length of Stay: 4  Current Medications: Scheduled Meds:   cholecalciferol  400 Units Oral Daily   cyanocobalamin  1,000 mcg Oral Daily   divalproex  125 mg Oral BID   docusate sodium  100 mg Oral BID   donepezil  10 mg Oral Daily   enoxaparin (LOVENOX) injection  40 mg Subcutaneous Q24H   fentaNYL (SUBLIMAZE) injection  25 mcg Intravenous Once   levETIRAcetam  500 mg Oral BID   LORazepam  0.25 mg Oral Q12H   risperiDONE  0.5 mg Oral QHS   sertraline  75 mg Oral Daily   sodium chloride flush  3 mL Intravenous Q12H    Continuous Infusions:   PRN Meds: albuterol, menthol-cetylpyridinium **OR** phenol, metoCLOPramide **OR** metoCLOPramide (REGLAN) injection, morphine CONCENTRATE, ondansetron, polyethylene glycol  Physical Exam Vitals reviewed.  Constitutional:      General: She is sleeping. She is not in acute distress. HENT:     Head: Normocephalic and atraumatic.  Cardiovascular:     Rate and Rhythm: Normal rate.  Pulmonary:     Effort: Pulmonary effort is normal.  Skin:    General: Skin is warm and dry.  Neurological:     Mental Status: She is easily aroused.  Psychiatric:        Mood and Affect: Mood normal.        Behavior: Behavior normal.             Vital Signs: BP 94/64 (BP Location: Left Arm)   Pulse 85   Temp 98.5 F (36.9 C) (Axillary)   Resp 16   Ht 5\' 4"  (1.626 m)   Wt 68.9 kg   SpO2 90%   BMI 26.07 kg/m  SpO2: SpO2: 90 % O2 Device: O2 Device: Room  Air O2 Flow Rate: O2 Flow Rate (L/min): 3 L/min       Palliative Assessment/Data: 20%      Patient Active Problem List   Diagnosis Date Noted   Dementia with behavioral disturbance (HCC) 11/13/2023   Anxiety  11/13/2023   Fall at home, initial encounter 11/12/2023   Femoral fracture (HCC) 11/12/2023   Compression fracture of body of thoracic vertebra (HCC) 11/12/2023   Seizure (HCC) 11/12/2023   Fall 11/12/2023   Urinary frequency 10/02/2018   Thyroid disease 03/10/2017   Bleeding nose 01/13/2017   Subacromial bursitis of left shoulder joint 12/19/2016   Left shoulder pain 12/19/2016   Atrial fibrillation, chronic (HCC) 01/02/2016   Moderate tricuspid insufficiency 01/03/2015   Chronic pulmonary hypertension (HCC) 01/03/2015   Moderate mitral insufficiency 08/10/2014   Stroke (HCC) 05/25/2014   Venous stasis of lower extremity 05/20/2011   TIA on medication 05/20/2011   Osteopenia 05/20/2011   Hyperlipidemia 05/20/2011   Hypertension 05/20/2011    Palliative Care Assessment & Plan   Patient Profile: 83 y.o. female  with past medical history of dementia, HLD, HTN, A-fib, seizure, and anxiety admitted on 11/12/2023 with a fall at her facility and found to have femur fracture.  Patient was on hospice services prior to admission at her memory care unit.  PMT consulted to discuss goals of care.    Today's Discussion: Patient is sleeping but easily arouses when I speak to her. States she is comfortable with no pain. Her breakfast tray is at bedside but she does not want anything right now. No family at bedside.  1055: Vmail for son Madelaine Bhat. Encouraged him to call PMT with questions or concerns.  1500: Met with son Renae Fickle in room at his request. We discussed the upcoming evaluation on Tuesday to see if the patient can return to [place]. Answered questions about discharge options and hospice service. Renae Fickle asked if social work could call him-- Microbiologist SW. Encouraged him to call PMT  with questions or concerns.  Recommendations/Plan: All care should be aimed at patient's comfort Son would like hospice to remain involved in patient's care to promote comfort Son is hopeful patient can be discharged to her ALF with hospice to provide care there but is unclear if this can be worked out at this time, [facility] working on this-- looks like [employer] will evaluate patient Tuesday Continued PMT support    Code Status:    Code Status Orders  (From admission, onward)           Start     Ordered   11/12/23 1306  Do not attempt resuscitation (DNR) - Comfort care  Continuous       Question Answer Comment  If patient has no pulse and is not breathing Do Not Attempt Resuscitation   In Pre-Arrest Conditions (Patient Is Breathing and Has a Pulse) Provide comfort measures. Relieve any mechanical airway obstruction. Avoid transfer unless required for comfort.   Consent: Discussion documented in EHR or advanced directives reviewed      11/12/23 1305         Extensive chart review has been completed prior to seeing the patient including labs, vital signs, imaging, progress/consult notes, orders, medications, and available advance directive documents.   Care plan was discussed with bedside RN  Time spent: 50 minutes  Thank you for allowing the Palliative Medicine Team to assist in the care of this patient.     Sherryll Burger, NP  Please contact Palliative Medicine Team phone at [phone] for questions and concerns.

## 2023-11-16 NOTE — Plan of Care (Signed)

## 2023-11-16 NOTE — Progress Notes (Addendum)
PROGRESS NOTE    Patricia Donaldson  ZHY:[id number] DOB: 1940/12/13 DOA: 11/12/2023 PCP: Smiley Houseman, NP    Chief Complaint  Patient presents with   Fall   Hip Pain    Brief Narrative:  Patient is a 83 year old female history of dementia and memory care unit at a nursing home and presented to the ED with an unwitnessed fall with inability to get up.  Imaging done concerning for left femoral fracture.  Orthopedics consulted and patient for IM nail today.  Admitting physician discussed with patient's son, Deshanda Ameri over the telephone who clearly advised patient to receive only palliative/comfort measures.  Patient would not want any treatment with curative intent unless comfort factors overwhelming.   Assessment & Plan:   Principal Problem:   Fall at home, initial encounter Active Problems:   Femoral fracture (HCC)   Hyperlipidemia   Hypertension   Atrial fibrillation, chronic (HCC)   Compression fracture of body of thoracic vertebra (HCC)   Seizure (HCC)   Fall   Dementia with behavioral disturbance (HCC)   Anxiety  1 acute left commuted intertrochanteric fracture of proximal left femur with varus angulation -Secondary to mechanical fall. -Patient seen in consultation by orthopedics and patient status post IM nail of left hip per orthopedics: Dr. Carola Frost 11/13/2023. -IM nail placed for palliation. -Continue current pain management. -Will need outpatient follow-up with orthopedics.  2.  Seizure disorder -Stable. -Continue Keppra, Depakote sprinkles.   3.  Compression fracture of the body of the thoracic vertebrae -Likely chronic. -Continue current pain management.  4.  Hypertension -BP soft and as such Coreg discontinued.  5.  Dementia -Continue Depakote sprinkles, Aricept, risperidone.   6.  Hyperlipidemia -Continue to hold statin and may consider discontinuation on discharge at this is noted that patient was being followed in the outpatient setting by hospice.  7.   Anxiety -Continue Ativan, Zoloft.   8.  Chronic atrial fibrillation -Due to soft blood pressure Coreg held.  -Patient not on anticoagulation prior to admission due to history of hemorrhagic stroke on anticoagulation per outpatient neurology note on 02/19/2022.  9.  Dehydration -Patient with dry mucous membranes on exam initially looked dehydrated. -Diuretics discontinued. -Patient was placed on gentle hydration.  10.  Postop acute blood loss anemia -Hemoglobin currently at 8.8 from 13.0 on admission. -Patient with no overt bleeding. -Follow H&H. -Transfusion threshold hemoglobin < 8.   DVT prophylaxis: Per orthopedics Code Status: Full Family Communication: No family at bedside.   Disposition: Likely back to facility with hospice following.  Status is: Inpatient Remains inpatient appropriate because: Severity of illness   Consultants:  Orthopedics: Earney Hamburg, PA 11/12/2023  Procedures:  CT head CT C-spine 11/12/2023 Chest x-ray 11/12/2023 Plain films of the pelvis 11/12/2023 Plain films of the left femur 11/12/2023 Intramedullary nailing of the left hip per orthopedics: Dr. Carola Frost 11/13/2023   Antimicrobials:  Anti-infectives (From admission, onward)    None         Subjective: Patient awake.  Patient refusing to answer any questions.  Breakfast tray at bedside untouched.    Objective: Vitals:   11/15/23 1056 11/15/23 1415 11/16/23 0500 11/16/23 0749  BP: 98/62 94/62 (!) 101/59 94/64  Pulse:  70 89 85  Resp:  16 16   Temp:   97.9 F (36.6 C) 98.5 F (36.9 C)  TempSrc:   Axillary Axillary  SpO2:  98% 90% 90%  Weight:      Height:  Intake/Output Summary (Last 24 hours) at 11/16/2023 1212 Last data filed at 11/16/2023 0525 Gross per 24 hour  Intake 0 ml  Output 800 ml  Net -800 ml   Filed Weights   11/12/23 0853 11/13/23 0838  Weight: 68.9 kg 68.9 kg    Examination:  General exam: NAD. Respiratory system: CTAB anterior lung fields.  No  wheezes, no crackles, no rhonchi.  Fair air movement.  Speaking in full sentences.  Cardiovascular system: Irregularly irregular.  No JVD, no murmurs rubs or gallops.  No lower extremity edema.  Gastrointestinal system: Abdomen is soft, nontender, nondistended, positive bowel sounds.  No rebound.  No guarding.  Central nervous system: Alert and oriented. No focal neurological deficits. Extremities: Left lower extremity with postop dressing in place, nontender to palpation.   Skin: No rashes, lesions or ulcers Psychiatry: Judgement and insight unable to assess. Mood & affect unable to assess.     Data Reviewed: I have personally reviewed following labs and imaging studies  CBC: Recent Labs  Lab 11/12/23 0918 11/13/23 1726 11/14/23 0652 11/15/23 0504  WBC 9.7 14.9* 12.6* 9.5  NEUTROABS 7.9*  --  9.9*  --   HGB 13.0 11.3* 11.2* 8.8*  HCT 40.4 36.1 35.7* 28.3*  MCV 89.4 92.6 92.2 93.4  PLT 258 243 215 212    Basic Metabolic Panel: Recent Labs  Lab 11/12/23 1010 11/12/23 1630 11/13/23 1726 11/14/23 0652 11/15/23 0504  NA 140 138 142 144 145  K 2.9* 4.1 4.3 4.7 4.4  CL 93* 95* 97* 103 103  CO2 36* 36* 31 26 33*  GLUCOSE 137* 151* 141* 115* 107*  BUN 20 21 29* 33* 34*  CREATININE 0.98 1.07* 1.34* 1.20* 1.01*  CALCIUM 8.4* 8.2* 8.7* 8.6* 8.6*  MG  --   --   --  2.1  --     GFR: Estimated Creatinine Clearance: 39.5 mL/min (A) (by C-G formula based on SCr of 1.01 mg/dL (H)).  Liver Function Tests: Recent Labs  Lab 11/12/23 1010  AST 30  ALT 25  ALKPHOS 56  BILITOT 0.8  PROT 6.7  ALBUMIN 2.7*    CBG: Recent Labs  Lab 11/13/23 1641 11/13/23 2031 11/14/23 1057 11/14/23 1241 11/14/23 1629  GLUCAP 144* 136* 111* 130* 97     Recent Results (from the past 240 hours)  Surgical pcr screen     Status: Abnormal   Collection Time: 11/12/23  3:56 PM   Specimen: Nasal Mucosa; Nasal Swab  Result Value Ref Range Status   MRSA, PCR POSITIVE (A) NEGATIVE Final     Comment: RESULT CALLED TO, READ BACK BY AND VERIFIED WITH: RN L HUDSON [id number] AT 1352 BY CM    Staphylococcus aureus POSITIVE (A) NEGATIVE Final    Comment: (NOTE) The Xpert SA Assay (FDA approved for NASAL specimens in patients 54 years of age and older), is one component of a comprehensive surveillance program. It is not intended to diagnose infection nor to guide or monitor treatment. Performed at [facility], [address] [place], [place] [address]   Urine Culture (for pregnant, neutropenic or urologic patients or patients with an indwelling urinary catheter)     Status: None   Collection Time: 11/14/23  8:43 AM   Specimen: Urine, Catheterized  Result Value Ref Range Status   Specimen Description URINE, CATHETERIZED  Final   Special Requests NONE  Final   Culture   Final    NO GROWTH Performed at [facility],  [address] [place], [place] [address]    Report Status 11/15/2023 FINAL  Final         Radiology Studies: No results found.       Scheduled Meds:  cholecalciferol  400 Units Oral Daily   cyanocobalamin  1,000 mcg Oral Daily   divalproex  125 mg Oral BID   docusate sodium  100 mg Oral BID   donepezil  10 mg Oral Daily   enoxaparin (LOVENOX) injection  40 mg Subcutaneous Q24H   fentaNYL (SUBLIMAZE) injection  25 mcg Intravenous Once   levETIRAcetam  500 mg Oral BID   LORazepam  0.25 mg Oral Q12H   risperiDONE  0.5 mg Oral QHS   sertraline  75 mg Oral Daily   sodium chloride flush  3 mL Intravenous Q12H   Continuous Infusions:      LOS: 4 days    Time spent: 35 minutes    Ramiro Harvest, MD [facility]   To contact the attending provider between 7A-7P or the covering provider during after hours 7P-7A, please log into the web site [email or web address] and access using universal [place] password for that web site. If you do not have the password, please call the hospital operator.  11/16/2023, 12:12 PM

## 2023-11-17 DIAGNOSIS — R569 Unspecified convulsions: Secondary | ICD-10-CM | POA: Diagnosis not present

## 2023-11-17 DIAGNOSIS — S22000A Wedge compression fracture of unspecified thoracic vertebra, initial encounter for closed fracture: Secondary | ICD-10-CM

## 2023-11-17 DIAGNOSIS — S72352P Displaced comminuted fracture of shaft of left femur, subsequent encounter for closed fracture with malunion: Secondary | ICD-10-CM | POA: Diagnosis not present

## 2023-11-17 DIAGNOSIS — E870 Hyperosmolality and hypernatremia: Secondary | ICD-10-CM

## 2023-11-17 DIAGNOSIS — W19XXXA Unspecified fall, initial encounter: Secondary | ICD-10-CM | POA: Diagnosis not present

## 2023-11-17 DIAGNOSIS — F03918 Unspecified dementia, unspecified severity, with other behavioral disturbance: Secondary | ICD-10-CM | POA: Diagnosis not present

## 2023-11-17 LAB — CBC
HCT: 31.1 % — ABNORMAL LOW (ref 36.0–46.0)
Hemoglobin: 9.4 g/dL — ABNORMAL LOW (ref 12.0–15.0)
MCH: 28.3 pg (ref 26.0–34.0)
MCHC: 30.2 g/dL (ref 30.0–36.0)
MCV: 93.7 fL (ref 80.0–100.0)
Platelets: 285 10*3/uL (ref 150–400)
RBC: 3.32 MIL/uL — ABNORMAL LOW (ref 3.87–5.11)
RDW: 14.3 % (ref 11.5–15.5)
WBC: 8.1 10*3/uL (ref 4.0–10.5)
nRBC: 0.4 % — ABNORMAL HIGH (ref 0.0–0.2)

## 2023-11-17 LAB — BASIC METABOLIC PANEL
Anion gap: 19 — ABNORMAL HIGH (ref 5–15)
BUN: 17 mg/dL (ref 8–23)
CO2: 27 mmol/L (ref 22–32)
Calcium: 8.9 mg/dL (ref 8.9–10.3)
Chloride: 105 mmol/L (ref 98–111)
Creatinine, Ser: 0.88 mg/dL (ref 0.44–1.00)
GFR, Estimated: >60 mL/min (ref >60)
Glucose, Bld: 129 mg/dL — ABNORMAL HIGH (ref 70–99)
Potassium: 3.6 mmol/L (ref 3.5–5.1)
Sodium: 151 mmol/L — ABNORMAL HIGH (ref 135–145)

## 2023-11-17 MED ORDER — MUPIROCIN 2 % EX OINT
1.0000 | TOPICAL_OINTMENT | Freq: Two times a day (BID) | CUTANEOUS | Status: DC
  Administered 2023-11-17 – 2023-11-21 (×9): 1 via NASAL
  Filled 2023-11-17 (×2): qty 22

## 2023-11-17 MED ORDER — CHLORHEXIDINE GLUCONATE CLOTH 2 % EX PADS
6.0000 | MEDICATED_PAD | Freq: Every day | CUTANEOUS | Status: DC
  Administered 2023-11-19 – 2023-11-21 (×3): 6 via TOPICAL

## 2023-11-17 MED ORDER — CARVEDILOL 12.5 MG PO TABS
12.5000 mg | ORAL_TABLET | Freq: Two times a day (BID) | ORAL | Status: DC
  Administered 2023-11-17 – 2023-11-21 (×8): 12.5 mg via ORAL
  Filled 2023-11-17 (×8): qty 1

## 2023-11-17 MED ORDER — DEXTROSE 5 % IV SOLN: INTRAVENOUS | Status: AC

## 2023-11-17 NOTE — Plan of Care (Signed)
  Problem: Education: Goal: Knowledge of General Education information will improve Description: Including pain rating scale, medication(s)/side effects and non-pharmacologic comfort measures Outcome: Not Progressing   Problem: Nutrition: Goal: Adequate nutrition will be maintained Outcome: Not Progressing   

## 2023-11-17 NOTE — TOC Progression Note (Signed)
Transition of Care [facility]) - Progression Note    Patient Details  Name: Patricia Donaldson MRN: [id number] Date of Birth: 1941-06-12  Transition of Care [facility]) CM/SW Contact  Lorri Frederick, LCSW Phone Number: 11/17/2023, 1:32 PM  Clinical Narrative:   TC [place].  Her Admin is now telling her she cannot come assess until Wed, one week after her admission.  She is out of the office today but will call tomorrow to get some documentation.  She is hopeful she can readmit after the eval on Wed.  CSW spoke with pt son Renae Fickle, updated him on the above.  He would like to be here with Kirt Boys comes, will touch base tomorrow.           Expected Discharge Plan and Services                                               Social Determinants of Health (SDOH) Interventions SDOH Screenings   Food Insecurity: No Food Insecurity (11/14/2023)  Housing: Patient Unable To Answer (11/14/2023)  Transportation Needs: No Transportation Needs (11/14/2023)  Utilities: Patient Unable To Answer (11/14/2023)  Financial Resource Strain: Low Risk  (10/16/2017)   Received from [facility], [facility]  Physical Activity: Unknown (10/16/2017)   Received from [facility], [facility]  Social Connections: Patient Unable To Answer (11/14/2023)  Stress: No Stress Concern Present (10/16/2017)   Received from [facility], [facility]  Tobacco Use: Medium Risk (11/13/2023)    Readmission Risk Interventions     No data to display

## 2023-11-17 NOTE — Care Management Important Message (Signed)
Important Message  Patient Details  Name: Patricia Donaldson MRN: [id number] Date of Birth: 14-Feb-1941   Important Message Given:  Yes - Medicare IM     Dorena Bodo 11/17/2023, 3:04 PM

## 2023-11-17 NOTE — Progress Notes (Addendum)
PROGRESS NOTE    Patricia Donaldson  ZOX:[id number] DOB: 1941-06-14 DOA: 11/12/2023 PCP: Smiley Houseman, NP    Chief Complaint  Patient presents with   Fall   Hip Pain    Brief Narrative:  Patient is a 83 year old female history of dementia and memory care unit at a nursing home and presented to the ED with an unwitnessed fall with inability to get up.  Imaging done concerning for left femoral fracture.  Orthopedics consulted and patient for IM nail today.  Admitting physician discussed with patient's son, Janith Francoeur over the telephone who clearly advised patient to receive only palliative/comfort measures.  Patient would not want any treatment with curative intent unless comfort factors overwhelming.   Assessment & Plan:   Principal Problem:   Fall at home, initial encounter Active Problems:   Femoral fracture (HCC)   Hyperlipidemia   Hypertension   Atrial fibrillation, chronic (HCC)   Compression fracture of body of thoracic vertebra (HCC)   Seizure (HCC)   Fall   Dementia with behavioral disturbance (HCC)   Anxiety   Hypernatremia  1 acute left commuted intertrochanteric fracture of proximal left femur with varus angulation -Secondary to mechanical fall. -Patient seen in consultation by orthopedics and patient status post IM nail of left hip per orthopedics: Dr. Carola Frost 11/13/2023. -IM nail placed for palliation. -Continue current pain management. -Will need outpatient follow-up with orthopedics.  2.  Seizure disorder -Stable. -Depakote sprinkles, Keppra.    3.  Compression fracture of the body of the thoracic vertebrae -Likely chronic. -Continue current pain management.  4.  Hypertension -BP was soft however has improved and as such we will resume Coreg.  5.  Dementia -Continue Depakote sprinkles, Aricept, risperidone.   6.  Hyperlipidemia -Continue to hold statin and may consider discontinuation on discharge at this is noted that patient was being followed in the  outpatient setting by hospice.  7.  Anxiety -Ativan, Zoloft.   8.  Chronic atrial fibrillation -Due to soft blood pressure Coreg held.  -Patient not on anticoagulation prior to admission due to history of hemorrhagic stroke on anticoagulation per outpatient neurology note on 02/19/2022. -BP improved and as such we will resume Coreg.  9.  Dehydration -Patient with dry mucous membranes on exam initially looked dehydrated. -Diuretics discontinued. -Gentle hydration.    10.  Postop acute blood loss anemia -Hemoglobin currently at 9.4 from 8.8 from 13.0 on admission. -Patient with no overt bleeding. -Follow H&H. -Transfusion threshold hemoglobin < 8.  11.  Hypernatremia -Likely secondary to dehydration. -Continue to hold diuretics. -Gentle hydration with D5W. -Repeat labs in the AM.   DVT prophylaxis: Per orthopedics Code Status: Full Family Communication: No family at bedside.   Disposition: Medically stable as of 11/14/2023.  Back to facility with hospice following if facility will take patient back.  Status is: Inpatient Remains inpatient appropriate because: Severity of illness   Consultants:  Orthopedics: Earney Hamburg, PA 11/12/2023  Procedures:  CT head CT C-spine 11/12/2023 Chest x-ray 11/12/2023 Plain films of the pelvis 11/12/2023 Plain films of the left femur 11/12/2023 Intramedullary nailing of the left hip per orthopedics: Dr. Carola Frost 11/13/2023   Antimicrobials:  Anti-infectives (From admission, onward)    None         Subjective: Patient awake.  Refusing to answer any questions however per RN she answered and spoke to her earlier on this morning.  Patient did not eat her breakfast.   Objective: Vitals:   11/16/23 2148 11/16/23 2203 11/17/23  0445 11/17/23 0815  BP: 115/71  128/74 139/84  Pulse: 82 91 92 95  Resp: 16  15   Temp: 98.3 F (36.8 C)  98.5 F (36.9 C) 97.9 F (36.6 C)  TempSrc: Oral  Oral Oral  SpO2: (!) 81% 95% 95% 90%  Weight:       Height:        Intake/Output Summary (Last 24 hours) at 11/17/2023 1155 Last data filed at 11/17/2023 1126 Gross per 24 hour  Intake 240 ml  Output --  Net 240 ml   Filed Weights   11/12/23 0853 11/13/23 0838  Weight: 68.9 kg 68.9 kg    Examination:  General exam: NAD. Respiratory system: Lungs clear to auscultation bilaterally anterior lung fields.  No wheezes, no crackles, no rhonchi. Cardiovascular system: Irregularly irregular.  No JVD, no murmurs rubs or gallops.  No pitting lower extremity edema.  Gastrointestinal system: Abdomen is soft, nontender, nondistended, positive bowel sounds.  No rebound.  No guarding.  Central nervous system: Alert and oriented. No focal neurological deficits. Extremities: Left lower extremity with postop dressing in place, nontender to palpation.   Skin: No rashes, lesions or ulcers Psychiatry: Judgement and insight unable to assess. Mood & affect unable to assess.     Data Reviewed: I have personally reviewed following labs and imaging studies  CBC: Recent Labs  Lab 11/12/23 0918 11/13/23 1726 11/14/23 0652 11/15/23 0504 11/17/23 0455  WBC 9.7 14.9* 12.6* 9.5 8.1  NEUTROABS 7.9*  --  9.9*  --   --   HGB 13.0 11.3* 11.2* 8.8* 9.4*  HCT 40.4 36.1 35.7* 28.3* 31.1*  MCV 89.4 92.6 92.2 93.4 93.7  PLT 258 243 215 212 285    Basic Metabolic Panel: Recent Labs  Lab 11/12/23 1630 11/13/23 1726 11/14/23 0652 11/15/23 0504 11/17/23 0455  NA 138 142 144 145 151*  K 4.1 4.3 4.7 4.4 3.6  CL 95* 97* 103 103 105  CO2 36* 31 26 33* 27  GLUCOSE 151* 141* 115* 107* 129*  BUN 21 29* 33* 34* 17  CREATININE 1.07* 1.34* 1.20* 1.01* 0.88  CALCIUM 8.2* 8.7* 8.6* 8.6* 8.9  MG  --   --  2.1  --   --     GFR: Estimated Creatinine Clearance: 45.4 mL/min (by C-G formula based on SCr of 0.88 mg/dL).  Liver Function Tests: Recent Labs  Lab 11/12/23 1010  AST 30  ALT 25  ALKPHOS 56  BILITOT 0.8  PROT 6.7  ALBUMIN 2.7*    CBG: Recent  Labs  Lab 11/13/23 1641 11/13/23 2031 11/14/23 1057 11/14/23 1241 11/14/23 1629  GLUCAP 144* 136* 111* 130* 97     Recent Results (from the past 240 hours)  Surgical pcr screen     Status: Abnormal   Collection Time: 11/12/23  3:56 PM   Specimen: Nasal Mucosa; Nasal Swab  Result Value Ref Range Status   MRSA, PCR POSITIVE (A) NEGATIVE Final    Comment: RESULT CALLED TO, READ BACK BY AND VERIFIED WITH: RN L HUDSON [id number] AT 1352 BY CM    Staphylococcus aureus POSITIVE (A) NEGATIVE Final    Comment: (NOTE) The Xpert SA Assay (FDA approved for NASAL specimens in patients 51 years of age and older), is one component of a comprehensive surveillance program. It is not intended to diagnose infection nor to guide or monitor treatment. Performed at [facility], [address], [place], [place] [address]   Urine Culture (for pregnant, neutropenic or  urologic patients or patients with an indwelling urinary catheter)     Status: None   Collection Time: 11/14/23  8:43 AM   Specimen: Urine, Catheterized  Result Value Ref Range Status   Specimen Description URINE, CATHETERIZED  Final   Special Requests NONE  Final   Culture   Final    NO GROWTH Performed at [facility], [address] [place], [place] [address]    Report Status 11/15/2023 FINAL  Final         Radiology Studies: No results found.       Scheduled Meds:  carvedilol  12.5 mg Oral BID   cholecalciferol  400 Units Oral Daily   cyanocobalamin  1,000 mcg Oral Daily   divalproex  125 mg Oral BID   docusate sodium  100 mg Oral BID   donepezil  10 mg Oral Daily   enoxaparin (LOVENOX) injection  40 mg Subcutaneous Q24H   fentaNYL (SUBLIMAZE) injection  25 mcg Intravenous Once   levETIRAcetam  500 mg Oral BID   LORazepam  0.25 mg Oral Q12H   risperiDONE  0.5 mg Oral QHS   sertraline  75 mg Oral Daily   sodium chloride flush  3 mL Intravenous Q12H   Continuous Infusions:  dextrose 75 mL/hr at  11/17/23 1126       LOS: 5 days    Time spent: 35 minutes    Ramiro Harvest, MD [facility]   To contact the attending provider between 7A-7P or the covering provider during after hours 7P-7A, please log into the web site [email or web address] and access using universal [place] password for that web site. If you do not have the password, please call the hospital operator.  11/17/2023, 11:55 AM

## 2023-11-18 ENCOUNTER — Inpatient Hospital Stay (HOSPITAL_COMMUNITY): Payer: Medicare Other

## 2023-11-18 DIAGNOSIS — Z711 Person with feared health complaint in whom no diagnosis is made: Secondary | ICD-10-CM | POA: Diagnosis not present

## 2023-11-18 DIAGNOSIS — S72352P Displaced comminuted fracture of shaft of left femur, subsequent encounter for closed fracture with malunion: Secondary | ICD-10-CM | POA: Diagnosis not present

## 2023-11-18 DIAGNOSIS — S72002A Fracture of unspecified part of neck of left femur, initial encounter for closed fracture: Secondary | ICD-10-CM

## 2023-11-18 DIAGNOSIS — Z789 Other specified health status: Secondary | ICD-10-CM

## 2023-11-18 DIAGNOSIS — Z515 Encounter for palliative care: Secondary | ICD-10-CM | POA: Diagnosis not present

## 2023-11-18 DIAGNOSIS — F03918 Unspecified dementia, unspecified severity, with other behavioral disturbance: Secondary | ICD-10-CM | POA: Diagnosis not present

## 2023-11-18 DIAGNOSIS — Z66 Do not resuscitate: Secondary | ICD-10-CM

## 2023-11-18 DIAGNOSIS — I10 Essential (primary) hypertension: Secondary | ICD-10-CM | POA: Diagnosis not present

## 2023-11-18 DIAGNOSIS — W19XXXA Unspecified fall, initial encounter: Secondary | ICD-10-CM | POA: Diagnosis not present

## 2023-11-18 DIAGNOSIS — R638 Other symptoms and signs concerning food and fluid intake: Secondary | ICD-10-CM

## 2023-11-18 LAB — CBC
HCT: 27.5 % — ABNORMAL LOW (ref 36.0–46.0)
Hemoglobin: 8.6 g/dL — ABNORMAL LOW (ref 12.0–15.0)
MCH: 28.9 pg (ref 26.0–34.0)
MCHC: 31.3 g/dL (ref 30.0–36.0)
MCV: 92.3 fL (ref 80.0–100.0)
Platelets: 262 10*3/uL (ref 150–400)
RBC: 2.98 MIL/uL — ABNORMAL LOW (ref 3.87–5.11)
RDW: 14.5 % (ref 11.5–15.5)
WBC: 8.6 10*3/uL (ref 4.0–10.5)
nRBC: 0.2 % (ref 0.0–0.2)

## 2023-11-18 LAB — BASIC METABOLIC PANEL
Anion gap: 8 (ref 5–15)
BUN: 13 mg/dL (ref 8–23)
CO2: 31 mmol/L (ref 22–32)
Calcium: 8.3 mg/dL — ABNORMAL LOW (ref 8.9–10.3)
Chloride: 107 mmol/L (ref 98–111)
Creatinine, Ser: 0.68 mg/dL (ref 0.44–1.00)
GFR, Estimated: >60 mL/min (ref >60)
Glucose, Bld: 149 mg/dL — ABNORMAL HIGH (ref 70–99)
Potassium: 3.2 mmol/L — ABNORMAL LOW (ref 3.5–5.1)
Sodium: 146 mmol/L — ABNORMAL HIGH (ref 135–145)

## 2023-11-18 MED ORDER — MORPHINE SULFATE (CONCENTRATE) 10 MG /0.5 ML PO SOLN
5.0000 mg | ORAL | Status: DC | PRN
  Administered 2023-11-18: 5 mg via ORAL
  Filled 2023-11-18: qty 0.5

## 2023-11-18 MED ORDER — POTASSIUM CHLORIDE CRYS ER 10 MEQ PO TBCR
40.0000 meq | EXTENDED_RELEASE_TABLET | Freq: Once | ORAL | Status: AC
  Administered 2023-11-18: 40 meq via ORAL
  Filled 2023-11-18: qty 4

## 2023-11-18 NOTE — Progress Notes (Signed)
PROGRESS NOTE    Patricia Donaldson  VQQ:[id number] DOB: 01/18/1941 DOA: 11/12/2023 PCP: Smiley Houseman, NP    Chief Complaint  Patient presents with   Fall   Hip Pain    Brief Narrative:  Patient is a 83 year old female history of dementia and memory care unit at a nursing home and presented to the ED with an unwitnessed fall with inability to get up.  Imaging done concerning for left femoral fracture.  Orthopedics consulted and patient for IM nail today.  Admitting physician discussed with patient's son, Tabatha Santalucia over the telephone who clearly advised patient to receive only palliative/comfort measures.  Patient would not want any treatment with curative intent unless comfort factors overwhelming.   Assessment & Plan:   Principal Problem:   Fall at home, initial encounter Active Problems:   Femoral fracture (HCC)   Hyperlipidemia   Hypertension   Atrial fibrillation, chronic (HCC)   Compression fracture of body of thoracic vertebra (HCC)   Seizure (HCC)   Fall   Dementia with behavioral disturbance (HCC)   Anxiety   Hypernatremia  1 acute left commuted intertrochanteric fracture of proximal left femur with varus angulation -Secondary to mechanical fall. -Patient seen in consultation by orthopedics and patient status post IM nail of left hip per orthopedics: Dr. Carola Frost 11/13/2023. -IM nail placed for palliation. -Continue current pain management. -Will need outpatient follow-up with orthopedics.  2.  Seizure disorder -Stable. -No seizures noted. -Continue Depakote sprinkles, Keppra.  3.  Compression fracture of the body of the thoracic vertebrae -Likely chronic. -Continue current pain management.  4.  Hypertension -BP was soft however has improved and as such Coreg resumed.   5.  Dementia -Continue Depakote sprinkles, Aricept, risperidone.   6.  Hyperlipidemia -Continue to hold statin and may consider discontinuation on discharge at this is noted that patient was  being followed in the outpatient setting by hospice.  7.  Anxiety -Continue Zoloft, Ativan.    8.  Chronic atrial fibrillation -Due to soft blood pressure Coreg held.  -Patient not on anticoagulation prior to admission due to history of hemorrhagic stroke on anticoagulation per outpatient neurology note on 02/19/2022. -BP improved and as such Coreg has been resumed.   9.  Dehydration -Patient with dry mucous membranes on exam initially looked dehydrated. -Diuretics discontinued. -Patient with poor oral intake.   -Gentle hydration.   10.  Postop acute blood loss anemia -Hemoglobin currently at 8.6 from 9.4 from 8.8 from 13.0 on admission. -Patient with no overt bleeding. -Follow H&H. -Transfusion threshold hemoglobin < 8.  11.  Hypernatremia -Likely secondary to dehydration. -Improved on D5W. -Continue to hold diuretics. -Repeat labs in the AM.  12.  Hypokalemia -Replete.   DVT prophylaxis: Per orthopedics Code Status: Full Family Communication: No family at bedside.   Disposition: Medically stable as of 11/14/2023.  Back to facility with hospice following if facility will take patient back.  Facility to come and assess patient tomorrow 11/19/2023.  Status is: Inpatient Remains inpatient appropriate because: Severity of illness   Consultants:  Orthopedics: Earney Hamburg, PA 11/12/2023  Procedures:  CT head CT C-spine 11/12/2023 Chest x-ray 11/12/2023 Plain films of the pelvis 11/12/2023 Plain films of the left femur 11/12/2023 Intramedullary nailing of the left hip per orthopedics: Dr. Carola Frost 11/13/2023   Antimicrobials:  Anti-infectives (From admission, onward)    None         Subjective: Patient sleeping deeply.  Objective: Vitals:   11/17/23 2006 11/18/23 0518 11/18/23 4332  11/18/23 0803  BP: 119/79 113/72 (!) 108/58 134/61  Pulse: 82 83 79 75  Resp: 16 18  18   Temp: 98.1 F (36.7 C) 98.3 F (36.8 C)  99 F (37.2 C)  TempSrc: Oral Oral  Oral  SpO2:  97% (!) 81% 91% 94%  Weight:      Height:        Intake/Output Summary (Last 24 hours) at 11/18/2023 1248 Last data filed at 11/17/2023 1700 Gross per 24 hour  Intake 407.92 ml  Output --  Net 407.92 ml   Filed Weights   11/12/23 0853 11/13/23 0838  Weight: 68.9 kg 68.9 kg    Examination:  General exam: NAD. Respiratory system: CTAB.  No wheezes, no crackles, no rhonchi.  Fair air movement.  Speaking in full sentences.  Cardiovascular system: Irregularly irregular.  No JVD, no murmurs rubs or gallops.  No pitting lower extremity edema.  Gastrointestinal system: Abdomen is soft, nontender, nondistended, positive bowel sounds.  No rebound.  No guarding.  Central nervous system: Alert and oriented. No focal neurological deficits. Extremities: Left lower extremity with postop dressing in place, nontender to palpation.   Skin: No rashes, lesions or ulcers Psychiatry: Judgement and insight unable to assess. Mood & affect unable to assess.     Data Reviewed: I have personally reviewed following labs and imaging studies  CBC: Recent Labs  Lab 11/12/23 0918 11/13/23 1726 11/14/23 0652 11/15/23 0504 11/17/23 0455 11/18/23 0535  WBC 9.7 14.9* 12.6* 9.5 8.1 8.6  NEUTROABS 7.9*  --  9.9*  --   --   --   HGB 13.0 11.3* 11.2* 8.8* 9.4* 8.6*  HCT 40.4 36.1 35.7* 28.3* 31.1* 27.5*  MCV 89.4 92.6 92.2 93.4 93.7 92.3  PLT 258 243 215 212 285 262    Basic Metabolic Panel: Recent Labs  Lab 11/13/23 1726 11/14/23 0652 11/15/23 0504 11/17/23 0455 11/18/23 0535  NA 142 144 145 151* 146*  K 4.3 4.7 4.4 3.6 3.2*  CL 97* 103 103 105 107  CO2 31 26 33* 27 31  GLUCOSE 141* 115* 107* 129* 149*  BUN 29* 33* 34* 17 13  CREATININE 1.34* 1.20* 1.01* 0.88 0.68  CALCIUM 8.7* 8.6* 8.6* 8.9 8.3*  MG  --  2.1  --   --   --     GFR: Estimated Creatinine Clearance: 49.9 mL/min (by C-G formula based on SCr of 0.68 mg/dL).  Liver Function Tests: Recent Labs  Lab 11/12/23 1010  AST 30   ALT 25  ALKPHOS 56  BILITOT 0.8  PROT 6.7  ALBUMIN 2.7*    CBG: Recent Labs  Lab 11/13/23 1641 11/13/23 2031 11/14/23 1057 11/14/23 1241 11/14/23 1629  GLUCAP 144* 136* 111* 130* 97     Recent Results (from the past 240 hours)  Surgical pcr screen     Status: Abnormal   Collection Time: 11/12/23  3:56 PM   Specimen: Nasal Mucosa; Nasal Swab  Result Value Ref Range Status   MRSA, PCR POSITIVE (A) NEGATIVE Final    Comment: RESULT CALLED TO, READ BACK BY AND VERIFIED WITH: RN L HUDSON [id number] AT 1352 BY CM    Staphylococcus aureus POSITIVE (A) NEGATIVE Final    Comment: (NOTE) The Xpert SA Assay (FDA approved for NASAL specimens in patients 40 years of age and older), is one component of a comprehensive surveillance program. It is not intended to diagnose infection nor to guide or monitor treatment. Performed at [facility], [address] [place], [place] [address]   Urine Culture (for pregnant, neutropenic or urologic patients or patients with an indwelling urinary catheter)     Status: None   Collection Time: 11/14/23  8:43 AM   Specimen: Urine, Catheterized  Result Value Ref Range Status   Specimen Description URINE, CATHETERIZED  Final   Special Requests NONE  Final   Culture   Final    NO GROWTH Performed at [facility], [address], [place], [place] [address]    Report Status 11/15/2023 FINAL  Final         Radiology Studies: DG CHEST PORT 1 VIEW Result Date: 11/18/2023 CLINICAL DATA:  Hypoxia EXAM: PORTABLE CHEST 1 VIEW COMPARISON:  11/12/2023. FINDINGS: Enlarged cardiopericardial silhouette with a calcified aorta. No pneumothorax or edema. Blunting of the left costophrenic angle. Possible tiny effusion. Old right-sided rib fractures. Osteopenia and degenerative changes. Film is rotated to the left IMPRESSION: Enlarged heart with a calcified aorta. Tiny left pleural effusion versus pleural thickening. Electronically Signed   By: Karen Kays M.D.   On: 11/18/2023 12:18         Scheduled Meds:  carvedilol  12.5 mg Oral BID   Chlorhexidine Gluconate Cloth  6 each Topical Q0600   cholecalciferol  400 Units Oral Daily   cyanocobalamin  1,000 mcg Oral Daily   divalproex  125 mg Oral BID   docusate sodium  100 mg Oral BID   donepezil  10 mg Oral Daily   enoxaparin (LOVENOX) injection  40 mg Subcutaneous Q24H   fentaNYL (SUBLIMAZE) injection  25 mcg Intravenous Once   levETIRAcetam  500 mg Oral BID   LORazepam  0.25 mg Oral Q12H   mupirocin ointment  1 Application Nasal BID   risperiDONE  0.5 mg Oral QHS   sertraline  75 mg Oral Daily   sodium chloride flush  3 mL Intravenous Q12H   Continuous Infusions:       LOS: 6 days    Time spent: 35 minutes    Ramiro Harvest, MD [facility]   To contact the attending provider between 7A-7P or the covering provider during after hours 7P-7A, please log into the web site [email or web address] and access using universal [place] password for that web site. If you do not have the password, please call the hospital operator.  11/18/2023, 12:48 PM

## 2023-11-18 NOTE — Progress Notes (Signed)
Daily Progress Note   Patient Name: Patricia Donaldson       Date: 11/18/2023 DOB: [birth year]  Age: 83 y.o. MRN#: [id number] Attending Physician: Rodolph Bong, MD Primary Care Physician: Smiley Houseman, NP Admit Date: 11/12/2023  Reason for Consultation/Follow-up: Non pain symptom management, Pain control, Psychosocial/spiritual support, and Terminal Care  Subjective: I have reviewed medical records including EPIC notes, MAR, any available advanced directives as necessary, and labs. Received report from primary RN - no acute concerns. Per RN, patient has slept most of the day. She refused breakfast; has only accepted one cup of water and few bites of applesauce with meds.  Went to visit patient at bedside - no family/visitors present. Patient was lying in bed asleep - I did not attempt to wake her to preserve comfort. Non-verbal gestures of pain/discomfort noted - facial grimacing. No respiratory distress, increased work of breathing, or secretions noted. She is on 3L O2 [place].  Requested RN provide dose of morphine concentrate.  Length of Stay: 6  Current Medications: Scheduled Meds:   carvedilol  12.5 mg Oral BID   Chlorhexidine Gluconate Cloth  6 each Topical Q0600   cholecalciferol  400 Units Oral Daily   cyanocobalamin  1,000 mcg Oral Daily   divalproex  125 mg Oral BID   docusate sodium  100 mg Oral BID   donepezil  10 mg Oral Daily   enoxaparin (LOVENOX) injection  40 mg Subcutaneous Q24H   fentaNYL (SUBLIMAZE) injection  25 mcg Intravenous Once   levETIRAcetam  500 mg Oral BID   LORazepam  0.25 mg Oral Q12H   mupirocin ointment  1 Application Nasal BID   risperiDONE  0.5 mg Oral QHS   sertraline  75 mg Oral Daily   sodium chloride flush  3 mL Intravenous Q12H    Continuous  Infusions:   PRN Meds: albuterol, menthol-cetylpyridinium **OR** phenol, metoCLOPramide **OR** metoCLOPramide (REGLAN) injection, morphine CONCENTRATE, ondansetron, polyethylene glycol  Physical Exam Vitals and nursing note reviewed.  Constitutional:      General: She is not in acute distress.    Appearance: She is ill-appearing.  Pulmonary:     Effort: No respiratory distress.  Skin:    General: Skin is warm and dry.  Neurological:     Comments: asleep  Vital Signs: BP 134/61 (BP Location: Right Arm)   Pulse 75   Temp 99 F (37.2 C) (Oral)   Resp 18   Ht 5\' 4"  (1.626 m)   Wt 68.9 kg   SpO2 94%   BMI 26.07 kg/m  SpO2: SpO2: 94 % O2 Device: O2 Device: Nasal Cannula O2 Flow Rate: O2 Flow Rate (L/min): 3 L/min  Intake/output summary:  Intake/Output Summary (Last 24 hours) at 11/18/2023 1208 Last data filed at 11/17/2023 1700 Gross per 24 hour  Intake 407.92 ml  Output --  Net 407.92 ml   LBM: Last BM Date :  (PTA) Baseline Weight: Weight: 68.9 kg Most recent weight: Weight: 68.9 kg       Palliative Assessment/Data: PPS 20%      Patient Active Problem List   Diagnosis Date Noted   Hypernatremia 11/17/2023   Dementia with behavioral disturbance (HCC) 11/13/2023   Anxiety 11/13/2023   Fall at home, initial encounter 11/12/2023   Femoral fracture (HCC) 11/12/2023   Compression fracture of body of thoracic vertebra (HCC) 11/12/2023   Seizure (HCC) 11/12/2023   Fall 11/12/2023   Urinary frequency 10/02/2018   Thyroid disease 03/10/2017   Bleeding nose 01/13/2017   Subacromial bursitis of left shoulder joint 12/19/2016   Left shoulder pain 12/19/2016   Atrial fibrillation, chronic (HCC) 01/02/2016   Moderate tricuspid insufficiency 01/03/2015   Chronic pulmonary hypertension (HCC) 01/03/2015   Moderate mitral insufficiency 08/10/2014   Stroke (HCC) 05/25/2014   Venous stasis of lower extremity 05/20/2011   TIA on medication 05/20/2011    Osteopenia 05/20/2011   Hyperlipidemia 05/20/2011   Hypertension 05/20/2011    Palliative Care Assessment & Plan   Patient Profile: 83 y.o. female with past medical history of dementia, HLD, HTN, A-fib, seizure, and anxiety admitted on 11/12/2023 with a fall at her facility and found to have femur fracture. Patient was on hospice services prior to admission at her memory care unit. PMT consulted to discuss goals of care.   Assessment: Principal Problem:   Fall at home, initial encounter Active Problems:   Hyperlipidemia   Hypertension   Atrial fibrillation, chronic (HCC)   Femoral fracture (HCC)   Compression fracture of body of thoracic vertebra (HCC)   Seizure (HCC)   Fall   Dementia with behavioral disturbance (HCC)   Anxiety   Hypernatremia   Concern about end of life  Recommendations/Plan: All care should be aimed at patient's comfort Continue DNR/DNI as previously documented - durable DNR form completed and placed in shadow chart. Copy was made and will be scanned into Vynca/ACP tab Goal is for discharge to facility with hospice. Per TOC note, [address] cannot reassess until Wednesday 1/22 Continue current comfort focused medication regimen - no changes PMT will continue to follow peripherally and visit incrementally. If there are any imminent needs please call the service directly  Symptom Management Morphine concentrate PRN pain/shortness of breath Scheduled ativan   Goals of Care and Additional Recommendations: Limitations on Scope of Treatment: Full Comfort Care  Code Status:    Code Status Orders  (From admission, onward)           Start     Ordered   11/12/23 1306  Do not attempt resuscitation (DNR) - Comfort care  Continuous       Question Answer Comment  If patient has no pulse and is not breathing Do Not Attempt Resuscitation   In Pre-Arrest Conditions (Patient Is Breathing and Has a Pulse) Provide comfort  measures. Relieve any mechanical  airway obstruction. Avoid transfer unless required for comfort.   Consent: Discussion documented in EHR or advanced directives reviewed      11/12/23 1305           Code Status History     This patient has a current code status but no historical code status.      Advance Directive Documentation    Flowsheet Row Most Recent Value  Type of Advance Directive Healthcare Power of Attorney, Out of facility DNR (pink MOST or yellow form)  Pre-existing out of facility DNR order (yellow form or pink MOST form) --  "MOST" Form in Place? --       Prognosis:  < 6 months  Discharge Planning: Skilled Nursing Facility with Hospice  Care plan was discussed with primary RN  Thank you for allowing the Palliative Medicine Team to assist in the care of this patient.     Haskel Khan, NP  Please contact Palliative Medicine Team phone at [phone] for questions and concerns.   *Portions of this note are a verbal dictation therefore any spelling and/or grammatical errors are due to the "Dragon Medical One" system interpretation.

## 2023-11-18 NOTE — TOC Progression Note (Signed)
Transition of Care [facility]) - Progression Note    Patient Details  Name: Patricia Donaldson MRN: [id number] Date of Birth: 1941/08/02  [facility]) CM/SW Contact  Lorri Frederick, LCSW Phone Number: 11/18/2023, 4:04 PM  Clinical Narrative:   CSW spoke with Aviva Kluver.  He has coordinated with Shemika at [facility] to meet at 1pm tomorrow for eval for return to [place].           Expected Discharge Plan and Services                                               Social Determinants of Health (SDOH) Interventions SDOH Screenings   Food Insecurity: No Food Insecurity (11/14/2023)  Housing: Patient Unable To Answer (11/14/2023)  Transportation Needs: No Transportation Needs (11/14/2023)  Utilities: Patient Unable To Answer (11/14/2023)  Financial Resource Strain: Low Risk  (10/16/2017)   Received from [facility], [facility]  Physical Activity: Unknown (10/16/2017)   Received from [facility], [facility]  Social Connections: Patient Unable To Answer (11/14/2023)  Stress: No Stress Concern Present (10/16/2017)   Received from [facility], [facility]  Tobacco Use: Medium Risk (11/13/2023)    Readmission Risk Interventions     No data to display

## 2023-11-19 DIAGNOSIS — I10 Essential (primary) hypertension: Secondary | ICD-10-CM | POA: Diagnosis not present

## 2023-11-19 DIAGNOSIS — W19XXXA Unspecified fall, initial encounter: Secondary | ICD-10-CM | POA: Diagnosis not present

## 2023-11-19 DIAGNOSIS — Y92009 Unspecified place in unspecified non-institutional (private) residence as the place of occurrence of the external cause: Secondary | ICD-10-CM | POA: Diagnosis not present

## 2023-11-19 LAB — BASIC METABOLIC PANEL
Anion gap: 9 (ref 5–15)
BUN: 15 mg/dL (ref 8–23)
CO2: 31 mmol/L (ref 22–32)
Calcium: 8.5 mg/dL — ABNORMAL LOW (ref 8.9–10.3)
Chloride: 106 mmol/L (ref 98–111)
Creatinine, Ser: 0.64 mg/dL (ref 0.44–1.00)
GFR, Estimated: >60 mL/min (ref >60)
Glucose, Bld: 102 mg/dL — ABNORMAL HIGH (ref 70–99)
Potassium: 4.3 mmol/L (ref 3.5–5.1)
Sodium: 146 mmol/L — ABNORMAL HIGH (ref 135–145)

## 2023-11-19 LAB — CBC
HCT: 29.9 % — ABNORMAL LOW (ref 36.0–46.0)
Hemoglobin: 9.4 g/dL — ABNORMAL LOW (ref 12.0–15.0)
MCH: 29.7 pg (ref 26.0–34.0)
MCHC: 31.4 g/dL (ref 30.0–36.0)
MCV: 94.3 fL (ref 80.0–100.0)
Platelets: 263 10*3/uL (ref 150–400)
RBC: 3.17 MIL/uL — ABNORMAL LOW (ref 3.87–5.11)
RDW: 14.7 % (ref 11.5–15.5)
WBC: 6.7 10*3/uL (ref 4.0–10.5)
nRBC: 0 % (ref 0.0–0.2)

## 2023-11-19 MED ORDER — ENSURE ENLIVE PO LIQD
237.0000 mL | Freq: Two times a day (BID) | ORAL | Status: DC
Start: 2023-11-19 — End: 2023-11-21
  Administered 2023-11-19 – 2023-11-21 (×3): 237 mL via ORAL

## 2023-11-19 NOTE — Progress Notes (Signed)
PROGRESS NOTE    Patricia Donaldson  ZOX:[id number] DOB: May 20, 1941 DOA: 11/12/2023 PCP: Smiley Houseman, NP    Chief Complaint  Patient presents with   Fall   Hip Pain    Brief Narrative:  Patient is a 83 year old female history of dementia and memory care unit at a nursing home and presented to the ED with an unwitnessed fall with inability to get up.  Imaging done concerning for left femoral fracture.  Orthopedics consulted and patient for IM nail today.  Admitting physician discussed with patient's son, Tesia Boston over the telephone who clearly advised patient to receive only palliative/comfort measures.  Patient would not want any treatment with curative intent unless comfort factors overwhelming.   Assessment & Plan:   Principal Problem:   Fall at home, initial encounter Active Problems:   Femoral fracture (HCC)   Hyperlipidemia   Hypertension   Atrial fibrillation, chronic (HCC)   Compression fracture of body of thoracic vertebra (HCC)   Seizure (HCC)   Fall   Dementia with behavioral disturbance (HCC)   Anxiety   Hypernatremia   acute left commuted intertrochanteric fracture of proximal left femur with varus angulation -Secondary to mechanical fall. -Patient seen in consultation by orthopedics and patient status post IM nail of left hip per orthopedics: Dr. Carola Frost 11/13/2023. -IM nail placed for palliation. -Continue current pain management. -Will need outpatient follow-up with orthopedics.   Seizure disorder -Stable. -No seizures noted. -Continue Depakote sprinkles, Keppra.    Compression fracture of the body of the thoracic vertebrae -Likely chronic. -Continue current pain management.   Hypertension -BP was soft however has improved and as such Coreg resumed.    Dementia -Continue Depakote sprinkles, Aricept, risperidone.   Hyperlipidemia -Continue to hold statin and may consider discontinuation on discharge at this is noted that patient was being followed in  the outpatient setting by hospice.   Anxiety -Continue Zoloft, Ativan.     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 -BP improved and as such Coreg has been resumed.    Dehydration -Patient with dry mucous membranes on exam initially looked dehydrated. -Diuretics discontinued. -Patient with poor oral intake.      Postop acute blood loss anemia -Hemoglobin currently at 8.6 from 9.4 from 8.8 from 13.0 on admission. -Patient with no overt bleeding. -Follow H&H. -Transfusion threshold hemoglobin < 8.   Hypernatremia -Likely secondary to dehydration. -Improved on D5W. -Continue to hold diuretics. -Repeat labs in the AM.  Hypokalemia -Replete.   DVT prophylaxis: Per orthopedics Code Status: Full Family Communication: No family at bedside.   Disposition: Medically stable as of 11/14/2023.  Back to facility with hospice following if facility will take patient back.  Facility to come and assess patient  11/19/2023.  Status is: Inpatient Remains inpatient appropriate because: Severity of illness   Consultants:  Orthopedics: Earney Hamburg, PA 11/12/2023  Procedures:  CT head CT C-spine 11/12/2023 Chest x-ray 11/12/2023 Plain films of the pelvis 11/12/2023 Plain films of the left femur 11/12/2023 Intramedullary nailing of the left hip per orthopedics: Dr. Carola Frost 11/13/2023      Subjective: sleeping  Objective: Vitals:   11/18/23 0521 11/18/23 0803 11/18/23 2055 11/19/23 0813  BP: (!) 108/58 134/61 103/62 111/65  Pulse: 79 75 70 77  Resp:  18 16 18   Temp:  99 F (37.2 C) 98.5 F (36.9 C) 97.7 F (36.5 C)  TempSrc:  Oral Oral Oral  SpO2: 91% 94% 98% 100%  Weight:      Height:        Intake/Output Summary (Last 24 hours) at 11/19/2023 1226 Last data filed at 11/19/2023 2951 Gross per 24 hour  Intake 0 ml  Output 400 ml  Net -400  ml   Filed Weights   11/12/23 0853 11/13/23 0838  Weight: 68.9 kg 68.9 kg    Examination:   General: Appearance:     Overweight female in no acute distress     Lungs:     respirations unlabored  Heart:    Normal heart rate.     MS:   All extremities are intact.   Neurologic:   Awake, alert       Data Reviewed: I have personally reviewed following labs and imaging studies  CBC: Recent Labs  Lab 11/14/23 0652 11/15/23 0504 11/17/23 0455 11/18/23 0535 11/19/23 0548  WBC 12.6* 9.5 8.1 8.6 6.7  NEUTROABS 9.9*  --   --   --   --   HGB 11.2* 8.8* 9.4* 8.6* 9.4*  HCT 35.7* 28.3* 31.1* 27.5* 29.9*  MCV 92.2 93.4 93.7 92.3 94.3  PLT 215 212 285 262 263    Basic Metabolic Panel: Recent Labs  Lab 11/14/23 0652 11/15/23 0504 11/17/23 0455 11/18/23 0535 11/19/23 0548  NA 144 145 151* 146* 146*  K 4.7 4.4 3.6 3.2* 4.3  CL 103 103 105 107 106  CO2 26 33* 27 31 31   GLUCOSE 115* 107* 129* 149* 102*  BUN 33* 34* 17 13 15   CREATININE 1.20* 1.01* 0.88 0.68 0.64  CALCIUM 8.6* 8.6* 8.9 8.3* 8.5*  MG 2.1  --   --   --   --     GFR: Estimated Creatinine Clearance: 49.9 mL/min (by C-G formula based on SCr of 0.64 mg/dL).  Liver Function Tests: No results for input(s): "AST", "ALT", "ALKPHOS", "BILITOT", "PROT", "ALBUMIN" in the last 168 hours.   CBG: Recent Labs  Lab 11/13/23 1641 11/13/23 2031 11/14/23 1057 11/14/23 1241 11/14/23 1629  GLUCAP 144* 136* 111* 130* 97     Recent Results (from the past 240 hours)  Surgical pcr screen     Status: Abnormal   Collection Time: 11/12/23  3:56 PM   Specimen: Nasal Mucosa; Nasal Swab  Result Value Ref Range Status   MRSA, PCR POSITIVE (A) NEGATIVE Final    Comment: RESULT CALLED TO, READ BACK BY AND VERIFIED WITH: RN L HUDSON [id number] AT 1352 BY CM    Staphylococcus aureus POSITIVE (A) NEGATIVE Final    Comment: (NOTE) The Xpert SA Assay (FDA approved for NASAL specimens in patients 25 years of age and older), is  one component of a comprehensive surveillance program. It is not intended to diagnose infection nor to guide or monitor treatment. Performed at [facility], [address] [place], [place] [address]   Urine Culture (for pregnant, neutropenic or urologic patients or patients with an indwelling urinary catheter)     Status: None   Collection Time: 11/14/23  8:43 AM   Specimen: Urine, Catheterized  Result Value Ref Range Status   Specimen Description URINE, CATHETERIZED  Final   Special Requests NONE  Final   Culture   Final    NO GROWTH Performed at [facility], [address], [place], [place] [address]    Report Status 11/15/2023 FINAL  Final         Radiology Studies: DG CHEST  PORT 1 VIEW Result Date: 11/18/2023 CLINICAL DATA:  Hypoxia EXAM: PORTABLE CHEST 1 VIEW COMPARISON:  11/12/2023. FINDINGS: Enlarged cardiopericardial silhouette with a calcified aorta. No pneumothorax or edema. Blunting of the left costophrenic angle. Possible tiny effusion. Old right-sided rib fractures. Osteopenia and degenerative changes. Film is rotated to the left IMPRESSION: Enlarged heart with a calcified aorta. Tiny left pleural effusion versus pleural thickening. Electronically Signed   By: Karen Kays M.D.   On: 11/18/2023 12:18         Scheduled Meds:  carvedilol  12.5 mg Oral BID   Chlorhexidine Gluconate Cloth  6 each Topical Q0600   cholecalciferol  400 Units Oral Daily   cyanocobalamin  1,000 mcg Oral Daily   divalproex  125 mg Oral BID   docusate sodium  100 mg Oral BID   donepezil  10 mg Oral Daily   enoxaparin (LOVENOX) injection  40 mg Subcutaneous Q24H   fentaNYL (SUBLIMAZE) injection  25 mcg Intravenous Once   levETIRAcetam  500 mg Oral BID   LORazepam  0.25 mg Oral Q12H   mupirocin ointment  1 Application Nasal BID   risperiDONE  0.5 mg Oral QHS   sertraline  75 mg Oral Daily   sodium chloride flush  3 mL Intravenous Q12H   Continuous  Infusions:       LOS: 7 days    Time spent: 35 minutes    Joseph Art, DO [facility]   To contact the attending provider between 7A-7P or the covering provider during after hours 7P-7A, please log into the web site [email or web address] and access using universal [place] password for that web site. If you do not have the password, please call the hospital operator.  11/19/2023, 12:26 PM

## 2023-11-19 NOTE — [place] FL2 (Signed)
[place] MEDICAID FL2 LEVEL OF CARE FORM     IDENTIFICATION  Patient Name: Patricia Donaldson Birthdate: 1941-08-13 Sex: female Admission Date (Current Location): 11/12/2023  [facility] and [place] Number:  Producer, television/film/video and Address:  [facility], [address], [place], [place] [address]      Provider Number: [id number]  Attending Physician Name and Address:  Joseph Art, DO  Relative Name and Phone Number:  Danica, Pedreira   [phone]    Current Level of Care: Hospital Recommended Level of Care: Memory Care [facility] memory care) Prior Approval Number:    Date Approved/Denied:   PASRR Number:    Discharge Plan: Other (Comment) [facility] memorey care)    Current Diagnoses: Patient Active Problem List   Diagnosis Date Noted   Hypernatremia 11/17/2023   Dementia with behavioral disturbance (HCC) 11/13/2023   Anxiety 11/13/2023   Fall at home, initial encounter 11/12/2023   Femoral fracture (HCC) 11/12/2023   Compression fracture of body of thoracic vertebra (HCC) 11/12/2023   Seizure (HCC) 11/12/2023   Fall 11/12/2023   Urinary frequency 10/02/2018   Thyroid disease 03/10/2017   Bleeding nose 01/13/2017   Subacromial bursitis of left shoulder joint 12/19/2016   Left shoulder pain 12/19/2016   Atrial fibrillation, chronic (HCC) 01/02/2016   Moderate tricuspid insufficiency 01/03/2015   Chronic pulmonary hypertension (HCC) 01/03/2015   Moderate mitral insufficiency 08/10/2014   Stroke (HCC) 05/25/2014   Venous stasis of lower extremity 05/20/2011   TIA on medication 05/20/2011   Osteopenia 05/20/2011   Hyperlipidemia 05/20/2011   Hypertension 05/20/2011    Orientation RESPIRATION BLADDER Height & Weight     Self  O2 External catheter, Incontinent Weight: 151 lb 14.4 oz (68.9 kg) Height:  5\' 4"  (162.6 cm)  BEHAVIORAL SYMPTOMS/MOOD NEUROLOGICAL BOWEL NUTRITION STATUS    Convulsions/Seizures Incontinent Diet  (regular diet)  AMBULATORY STATUS COMMUNICATION OF NEEDS Skin   Total Care Verbally Surgical wounds                       Personal Care Assistance Level of Assistance  Total care       Total Care Assistance: Maximum assistance   Functional Limitations Info  Sight, Hearing, Speech Sight Info: Adequate Hearing Info: Impaired Speech Info: Adequate    SPECIAL CARE FACTORS FREQUENCY                       Contractures Contractures Info: Not present    Additional Factors Info  Code Status, Allergies Code Status Info: DNR Allergies Info: Erythromycin, Aspirin, Other, Penicillins, Lobster (Shellfish Allergy)           Current Medications (11/19/2023):  This is the current hospital active medication list Current Facility-Administered Medications  Medication Dose Route Frequency Provider Last Rate Last Admin   albuterol (PROVENTIL) (2.5 MG/3ML) 0.083% nebulizer solution 2.5 mg  2.5 mg Nebulization Q4H PRN Montez Morita, PA-C       carvedilol (COREG) tablet 12.5 mg  12.5 mg Oral BID Rodolph Bong, MD   12.5 mg at 11/18/23 5621   Chlorhexidine Gluconate Cloth 2 % PADS 6 each  6 each Topical Q0600 Rodolph Bong, MD   6 each at 11/19/23 0544   cholecalciferol (VITAMIN D3) 10 MCG (400 UNIT) tablet 400 Units  400 Units Oral Daily Montez Morita, PA-C   400 Units at 11/18/23 3086   cyanocobalamin (VITAMIN B12) tablet 1,000 mcg  1,000 mcg Oral Daily Montez Morita, PA-C   1,000 mcg at 11/18/23 9562   divalproex (DEPAKOTE SPRINKLE) capsule 125 mg  125 mg Oral BID Montez Morita, PA-C   125 mg at 11/18/23 1308   docusate sodium (COLACE) capsule 100 mg  100 mg Oral BID Montez Morita, PA-C   100 mg at 11/17/23 1108   donepezil (ARICEPT) tablet 10 mg  10 mg Oral Daily Montez Morita, PA-C   10 mg at 11/18/23 6578   enoxaparin (LOVENOX) injection 40 mg  40 mg Subcutaneous Q24H Montez Morita, PA-C   40 mg at 11/18/23 4696   fentaNYL (SUBLIMAZE) injection 25 mcg  25 mcg Intravenous Once Montez Morita, PA-C       levETIRAcetam (KEPPRA) tablet 500 mg  500 mg Oral BID Montez Morita, PA-C   500 mg at 11/18/23 2952   LORazepam (ATIVAN) tablet 0.25 mg  0.25 mg Oral Q12H Montez Morita, PA-C   0.25 mg at 11/18/23 8413   menthol-cetylpyridinium (CEPACOL) lozenge 3 mg  1 lozenge Oral PRN Montez Morita, PA-C       Or   phenol (CHLORASEPTIC) mouth spray 1 spray  1 spray Mouth/Throat PRN Montez Morita, PA-C       metoCLOPramide (REGLAN) tablet 5-10 mg  5-10 mg Oral Q8H PRN Montez Morita, PA-C       Or   metoCLOPramide (REGLAN) injection 5-10 mg  5-10 mg Intravenous Q8H PRN Montez Morita, PA-C       morphine CONCENTRATE 10 mg / 0.5 ml oral solution 5 mg  5 mg Oral Q1H PRN Haskel Khan, NP   5 mg at 11/18/23 1630   mupirocin ointment (BACTROBAN) 2 % 1 Application  1 Application Nasal BID Rodolph Bong, MD   1 Application at 11/18/23 2325   ondansetron (ZOFRAN-ODT) disintegrating tablet 4 mg  4 mg Oral Q6H PRN Montez Morita, PA-C       polyethylene glycol (MIRALAX / GLYCOLAX) packet 17 g  17 g Oral Daily PRN Montez Morita, PA-C       risperiDONE (RISPERDAL) tablet 0.5 mg  0.5 mg Oral QHS Montez Morita, PA-C   0.5 mg at 11/17/23 2149   sertraline (ZOLOFT) tablet 75 mg  75 mg Oral Daily Montez Morita, PA-C   75 mg at 11/18/23 2440   sodium chloride flush (NS) 0.9 % injection 3 mL  3 mL Intravenous Q12H Montez Morita, PA-C   3 mL at 11/18/23 2325     Discharge Medications: Please see discharge summary for a list of discharge medications.  Relevant Imaging Results:  Relevant Lab Results:   Additional Information SSN: [id number].  Pt will continue comfort care services with [facility].  Lorri Frederick, LCSW

## 2023-11-19 NOTE — TOC Progression Note (Signed)
Transition of Care [facility]) - Progression Note    Patient Details  Name: Patricia Donaldson MRN: [id number] Date of Birth: [birth year]  Transition of Care [facility]) CM/SW Contact  Lorri Frederick, LCSW Phone Number: 11/19/2023, 1:43 PM  Clinical Narrative:   Requested notes emailed to Shemeka/[place]: [email or web address].     1300: Shemeka and another staff member from [facility] came to assess pt.  Pt son Renae Fickle also present.  Pt was able to complete a transfer to the chair with 2 person assist, which was what Shemeka needed to see for return to [place].  She will communicate with her leadership and get back to [place].      Expected Discharge Plan and Services                                               Social Determinants of Health (SDOH) Interventions SDOH Screenings   Food Insecurity: No Food Insecurity (11/14/2023)  Housing: Patient Unable To Answer (11/14/2023)  Transportation Needs: No Transportation Needs (11/14/2023)  Utilities: Patient Unable To Answer (11/14/2023)  Financial Resource Strain: Low Risk  (10/16/2017)   [facility] from [facility], [facility]  Physical Activity: Unknown (10/16/2017)   [facility], [facility]  Social Connections: Patient Unable To Answer (11/14/2023)  Stress: No Stress Concern Present (10/16/2017)   [facility]  Tobacco Use: Medium Risk (11/13/2023)    Readmission Risk Interventions     No data to display

## 2023-11-20 DIAGNOSIS — I10 Essential (primary) hypertension: Secondary | ICD-10-CM | POA: Diagnosis not present

## 2023-11-20 DIAGNOSIS — Y92009 Unspecified place in unspecified non-institutional (private) residence as the place of occurrence of the external cause: Secondary | ICD-10-CM | POA: Diagnosis not present

## 2023-11-20 DIAGNOSIS — Z515 Encounter for palliative care: Secondary | ICD-10-CM | POA: Diagnosis not present

## 2023-11-20 DIAGNOSIS — S72002A Fracture of unspecified part of neck of left femur, initial encounter for closed fracture: Secondary | ICD-10-CM | POA: Diagnosis not present

## 2023-11-20 DIAGNOSIS — W19XXXA Unspecified fall, initial encounter: Secondary | ICD-10-CM | POA: Diagnosis not present

## 2023-11-20 NOTE — Progress Notes (Signed)
PROGRESS NOTE    Patricia Donaldson  ZOX:[id number] DOB: 1941-07-06 DOA: 11/12/2023 PCP: Smiley Houseman, NP    Chief Complaint  Patient presents with   Fall   Hip Pain    Brief Narrative:  Patient is a 83 year old female history of dementia and memory care unit at a nursing home and presented to the ED with an unwitnessed fall with inability to get up.  Imaging done concerning for left femoral fracture.  Orthopedics consulted and patient for IM nail today.  Admitting physician discussed with patient's son, Machaela Gratton over the telephone who clearly advised patient to receive only palliative/comfort measures.  Patient would not want any treatment with curative intent unless comfort factors overwhelming.   Assessment & Plan:   Principal Problem:   Fall at home, initial encounter Active Problems:   Femoral fracture (HCC)   Hyperlipidemia   Hypertension   Atrial fibrillation, chronic (HCC)   Compression fracture of body of thoracic vertebra (HCC)   Seizure (HCC)   Fall   Dementia with behavioral disturbance (HCC)   Anxiety   Hypernatremia   acute left commuted intertrochanteric fracture of proximal left femur with varus angulation -Secondary to mechanical fall. -Patient seen in consultation by orthopedics and patient status post IM nail of left hip per orthopedics: Dr. Carola Frost 11/13/2023. -IM nail placed for palliation. -Continue current pain management. -Will need outpatient follow-up with orthopedics.   Seizure disorder -Stable. -No seizures noted. -Continue Depakote sprinkles, Keppra.    Compression fracture of the body of the thoracic vertebrae -Likely chronic. -Continue current pain management.   Hypertension -BP was soft however has improved and as such Coreg resumed.    Dementia -Continue Depakote sprinkles, Aricept, risperidone.   Hyperlipidemia -Continue to hold statin and may consider discontinuation on discharge at this is noted that patient was being followed in  the outpatient setting by hospice.   Anxiety -Continue Zoloft, Ativan.     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 -BP improved and as such Coreg has been resumed.    Dehydration -Patient with dry mucous membranes on exam initially looked dehydrated. -Diuretics discontinued. -Patient with poor oral intake.      Postop acute blood loss anemia -Hemoglobin currently at 8.6 from 9.4 from 8.8 from 13.0 on admission. -Patient with no overt bleeding. -Follow H&H. -Transfusion threshold hemoglobin < 8.   Hypernatremia -Likely secondary to dehydration. -Improved on D5W. -Continue to hold diuretics. -Repeat labs in the AM.  Hypokalemia -Replete.   DVT prophylaxis: Per orthopedics Code Status: Full Family Communication: No family at bedside.   Disposition: Medically stable as of 11/14/2023.  Back to facility with hospice following if facility will take patient back.  Facility to come and assess patient  11/19/2023.-- continue to await   Status is: Inpatient Remains inpatient appropriate because: Severity of illness   Consultants:  Orthopedics: Earney Hamburg, PA 11/12/2023  Procedures:  CT head CT C-spine 11/12/2023 Chest x-ray 11/12/2023 Plain films of the pelvis 11/12/2023 Plain films of the left femur 11/12/2023 Intramedullary nailing of the left hip per orthopedics: Dr. Carola Frost 11/13/2023      Subjective: sleeping  Objective: Vitals:   11/19/23 0813 11/19/23 2200 11/20/23 0400 11/20/23 0945  BP: 111/65 113/68 115/80 107/77  Pulse: 77 96 64 79  Resp: 18 18 18 16   Temp: 97.7 F (36.5 C) 98.9 F (37.2 C) 98.9 F (37.2 C) 97.7  F (36.5 C)  TempSrc: Oral Axillary Oral Axillary  SpO2: 100% 100% 100% 100%  Weight:      Height:        Intake/Output Summary (Last 24 hours) at 11/20/2023 1122 Last data filed at 11/20/2023 0500 Gross  per 24 hour  Intake 60 ml  Output 551 ml  Net -491 ml   Filed Weights   11/12/23 0853 11/13/23 0838  Weight: 68.9 kg 68.9 kg    Examination:   General: Appearance:     Overweight female in no acute distress     Lungs:     respirations unlabored  Heart:    Normal heart rate.     MS:   All extremities are intact.   Neurologic:   Will awake and interact appropriately        Data Reviewed: I have personally reviewed following labs and imaging studies  CBC: Recent Labs  Lab 11/14/23 0652 11/15/23 0504 11/17/23 0455 11/18/23 0535 11/19/23 0548  WBC 12.6* 9.5 8.1 8.6 6.7  NEUTROABS 9.9*  --   --   --   --   HGB 11.2* 8.8* 9.4* 8.6* 9.4*  HCT 35.7* 28.3* 31.1* 27.5* 29.9*  MCV 92.2 93.4 93.7 92.3 94.3  PLT 215 212 285 262 263    Basic Metabolic Panel: Recent Labs  Lab 11/14/23 0652 11/15/23 0504 11/17/23 0455 11/18/23 0535 11/19/23 0548  NA 144 145 151* 146* 146*  K 4.7 4.4 3.6 3.2* 4.3  CL 103 103 105 107 106  CO2 26 33* 27 31 31   GLUCOSE 115* 107* 129* 149* 102*  BUN 33* 34* 17 13 15   CREATININE 1.20* 1.01* 0.88 0.68 0.64  CALCIUM 8.6* 8.6* 8.9 8.3* 8.5*  MG 2.1  --   --   --   --     GFR: Estimated Creatinine Clearance: 49.9 mL/min (by C-G formula based on SCr of 0.64 mg/dL).  Liver Function Tests: No results for input(s): "AST", "ALT", "ALKPHOS", "BILITOT", "PROT", "ALBUMIN" in the last 168 hours.   CBG: Recent Labs  Lab 11/13/23 1641 11/13/23 2031 11/14/23 1057 11/14/23 1241 11/14/23 1629  GLUCAP 144* 136* 111* 130* 97     Recent Results (from the past 240 hours)  Surgical pcr screen     Status: Abnormal   Collection Time: 11/12/23  3:56 PM   Specimen: Nasal Mucosa; Nasal Swab  Result Value Ref Range Status   MRSA, PCR POSITIVE (A) NEGATIVE Final    Comment: RESULT CALLED TO, READ BACK BY AND VERIFIED WITH: RN L HUDSON [id number] AT 1352 BY CM    Staphylococcus aureus POSITIVE (A) NEGATIVE Final    Comment: (NOTE) The Xpert SA Assay  (FDA approved for NASAL specimens in patients 42 years of age and older), is one component of a comprehensive surveillance program. It is not intended to diagnose infection nor to guide or monitor treatment. Performed at [facility], [address] [place], [place] [address]   Urine Culture (for pregnant, neutropenic or urologic patients or patients with an indwelling urinary catheter)     Status: None   Collection Time: 11/14/23  8:43 AM   Specimen: Urine, Catheterized  Result Value Ref Range Status   Specimen Description URINE, CATHETERIZED  Final   Special Requests NONE  Final   Culture   Final    NO GROWTH Performed at [facility], [address] [place], [place] [address]    Report Status 11/15/2023 FINAL  Final  Radiology Studies: No results found.        Scheduled Meds:  carvedilol  12.5 mg Oral BID   Chlorhexidine Gluconate Cloth  6 each Topical Q0600   cholecalciferol  400 Units Oral Daily   cyanocobalamin  1,000 mcg Oral Daily   divalproex  125 mg Oral BID   docusate sodium  100 mg Oral BID   donepezil  10 mg Oral Daily   enoxaparin (LOVENOX) injection  40 mg Subcutaneous Q24H   feeding supplement  237 mL Oral BID BM   fentaNYL (SUBLIMAZE) injection  25 mcg Intravenous Once   levETIRAcetam  500 mg Oral BID   LORazepam  0.25 mg Oral Q12H   mupirocin ointment  1 Application Nasal BID   risperiDONE  0.5 mg Oral QHS   sertraline  75 mg Oral Daily   sodium chloride flush  3 mL Intravenous Q12H   Continuous Infusions:       LOS: 8 days    Time spent: 35 minutes    Joseph Art, DO [facility]   To contact the attending provider between 7A-7P or the covering provider during after hours 7P-7A, please log into the web site [email or web address] and access using universal [place] password for that web site. If you do not have the password, please call the hospital operator.  11/20/2023, 11:22 AM

## 2023-11-20 NOTE — Discharge Summary (Addendum)
Physician Discharge Summary  Patricia Donaldson GMW:[id number] DOB: Mar 11, 1941 DOA: 11/12/2023  PCP: Smiley Houseman, NP  Admit date: 11/12/2023 Discharge date: 11/20/2023     Recommendations for Outpatient Follow-Up:   Resume hospice Diuretics held for now-- monitor for need for resumption   Discharge Diagnosis:   Principal Problem:   Fall at home, initial encounter Active Problems:   Femoral fracture (HCC)   Hyperlipidemia   Hypertension   Atrial fibrillation, chronic (HCC)   Compression fracture of body of thoracic vertebra (HCC)   Seizure (HCC)   Fall   Dementia with behavioral disturbance (HCC)   Anxiety   Hypernatremia    Discharge Condition: Improved.  Diet recommendation:DYS    Code status: DNR   History of Present Illness:   Patient is a 83 year old female history of dementia and memory care unit at a nursing home and presented to the ED with an unwitnessed fall with inability to get up. Imaging done concerning for left femoral fracture. Orthopedics consulted and patient for IM nail today. Admitting physician discussed with patient's son, Britney Beneke over the telephone who clearly advised patient to receive only palliative/comfort measures. Patient would not want any treatment with curative intent unless comfort factors overwhelming.    Hospital Course by Problem:    acute left commuted intertrochanteric fracture of proximal left femur with varus angulation -Secondary to mechanical fall. -Patient seen in consultation by orthopedics and patient status post IM nail of left hip per orthopedics: Dr. Carola Frost 11/13/2023. -IM nail placed for palliation. -Continue current pain management. -Will need outpatient follow-up with orthopedics.    Seizure disorder -Stable. -No seizures noted. -Continue Depakote sprinkles, Keppra.     Compression fracture of the body of the thoracic vertebrae -Likely chronic. -Continue current pain management.     Hypertension -BP was soft initially however has improved and as such Coreg resumed.     Dementia -Continue Depakote sprinkles, Aricept, risperidone.    Hyperlipidemia -Continue to hold statin and may consider discontinuation on discharge at this is noted that patient was being followed in the outpatient setting by hospice.    Anxiety -Continue Zoloft, Ativan.      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 -BP improved and as such Coreg has been resumed.     Dehydration -Patient with dry mucous membranes on exam initially looked dehydrated. -Diuretics discontinued. -Patient with poor oral intake.        Postop acute blood loss anemia -Hemoglobin currently at 8.6 from 9.4 from 8.8 from 13.0 on admission. -Patient with no overt bleeding. -Follow H&H. -Transfusion threshold hemoglobin < 8.    Hypernatremia -Likely secondary to dehydration. -Improved on D5W. -Continue to hold diuretics.   Hypokalemia -Repleted      Medical Consultants:      Discharge Exam:   Vitals:   11/20/23 0400 11/20/23 0945  BP: 115/80 107/77  Pulse: 64 79  Resp: 18 16  Temp: 98.9 F (37.2 C) 97.7 F (36.5 C)  SpO2: 100% 100%   Vitals:   11/19/23 0813 11/19/23 2200 11/20/23 0400 11/20/23 0945  BP: 111/65 113/68 115/80 107/77  Pulse: 77 96 64 79  Resp: 18 18 18 16   Temp: 97.7 F (36.5 C) 98.9 F (37.2 C) 98.9 F (37.2 C) 97.7 F (36.5 C)  TempSrc: Oral Axillary Oral Axillary  SpO2: 100% 100% 100% 100%  Weight:  Height:        General exam: Appears calm and comfortable.    The results of significant diagnostics from this hospitalization (including imaging, microbiology, ancillary and laboratory) are listed below for reference.     Procedures and Diagnostic Studies:   DG FEMUR PORT MIN 2 VIEWS LEFT Result Date: 11/13/2023 CLINICAL  DATA:  Fracture, postop. EXAM: LEFT FEMUR PORTABLE 2 VIEWS COMPARISON:  Preoperative imaging. FINDINGS: Femoral intramedullary nail with trans trochanteric and distal locking screw fixation traverse proximal femur fracture. Improved fracture alignment from preoperative imaging. Recent postsurgical change includes air and edema in the soft tissues. IMPRESSION: ORIF proximal femur fracture. Electronically Signed   By: Narda Rutherford M.D.   On: 11/13/2023 15:06   DG FEMUR MIN 2 VIEWS LEFT Result Date: 11/13/2023 CLINICAL DATA:  Elective surgery. EXAM: LEFT FEMUR 2 VIEWS COMPARISON:  Preoperative imaging FINDINGS: Six fluoroscopic spot views of the left femur obtained in the operating room. Femoral intramedullary nail with trans trochanteric and distal locking screw fixation traverse proximal femur fracture. Fluoroscopy time 83.2 seconds. Dose 23.62 mGy. IMPRESSION: Intraoperative fluoroscopy during proximal femur fracture fixation. Electronically Signed   By: Narda Rutherford M.D.   On: 11/13/2023 15:05   DG C-Arm 1-60 Min-No Report Result Date: 11/13/2023 Fluoroscopy was utilized by the requesting physician.  No radiographic interpretation.   DG Pelvis Portable Result Date: 11/12/2023 CLINICAL DATA:  Fall EXAM: PORTABLE PELVIS 1-2 VIEWS; LEFT FEMUR PORTABLE 2 VIEWS COMPARISON:  None Available. FINDINGS: Acute, comminuted intertrochanteric fracture of the proximal left femur with approximately 90 degrees of varus angulation. Hip joint alignment is maintained without dislocation. Bony pelvis intact without evidence of fracture or diastasis. Knee joint alignment maintained with medial compartment joint space narrowing. IMPRESSION: Acute, comminuted intertrochanteric fracture of the proximal left femur with varus angulation. Electronically Signed   By: Duanne Guess D.O.   On: 11/12/2023 11:05   DG FEMUR PORT MIN 2 VIEWS LEFT Result Date: 11/12/2023 CLINICAL DATA:  Fall EXAM: PORTABLE PELVIS 1-2 VIEWS;  LEFT FEMUR PORTABLE 2 VIEWS COMPARISON:  None Available. FINDINGS: Acute, comminuted intertrochanteric fracture of the proximal left femur with approximately 90 degrees of varus angulation. Hip joint alignment is maintained without dislocation. Bony pelvis intact without evidence of fracture or diastasis. Knee joint alignment maintained with medial compartment joint space narrowing. IMPRESSION: Acute, comminuted intertrochanteric fracture of the proximal left femur with varus angulation. Electronically Signed   By: Duanne Guess D.O.   On: 11/12/2023 11:05   DG Chest Port 1 View Result Date: 11/12/2023 CLINICAL DATA:  Fall EXAM: PORTABLE CHEST 1 VIEW COMPARISON:  10/19/2022 FINDINGS: Cardiomegaly. Aortic atherosclerosis. No focal airspace consolidation, pleural effusion, or pneumothorax. IMPRESSION: No active disease. Electronically Signed   By: Duanne Guess D.O.   On: 11/12/2023 11:04   CT Head Wo Contrast Result Date: 11/12/2023 CLINICAL DATA:  Head trauma, minor (Age >= 65y); Neck trauma (Age >= 65y) EXAM: CT HEAD WITHOUT CONTRAST CT CERVICAL SPINE WITHOUT CONTRAST TECHNIQUE: Multidetector CT imaging of the head and cervical spine was performed following the standard protocol without intravenous contrast. Multiplanar CT image reconstructions of the cervical spine were also generated. RADIATION DOSE REDUCTION: This exam was performed according to the departmental dose-optimization program which includes automated exposure control, adjustment of the mA and/or kV according to patient size and/or use of iterative reconstruction technique. COMPARISON:  None Available. FINDINGS: CT HEAD FINDINGS Brain: No hemorrhage. No hydrocephalus. No extra-axial fluid collection. No mass effect. No mass lesion. No CT evidence  of an acute cortical infarct. 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 Vascular: No hyperdense vessel or unexpected  calcification. Skull: Normal. Negative for fracture or focal lesion. Sinuses/Orbits: No middle ear or mastoid effusion. Paranasal sinuses are notable for frothy secretions in the left sphenoid sinus. Bilateral lens replacement. Orbits are otherwise unremarkable. Other: None. CT CERVICAL SPINE FINDINGS Alignment: Normal. Skull base and vertebrae: No acute fracture. No primary bone lesion or focal pathologic process. Soft tissues and spinal canal: No prevertebral fluid or swelling. No visible canal hematoma. Disc levels:  No CT evidence of high-grade spinal canal stenosis. Upper chest: Negative. Other: There is an age indeterminate minimal superior endplate compression deformity at T2. Correlate with point tenderness. IMPRESSION: 1. No CT evidence of intracranial injury. 2. No acute fracture or traumatic subluxation of the cervical spine. 3. Age indeterminate minimal superior endplate compression deformity at T2. Correlate with point tenderness. Electronically Signed   By: Lorenza Cambridge M.D.   On: 11/12/2023 10:12   CT Cervical Spine Wo Contrast Result Date: 11/12/2023 CLINICAL DATA:  Head trauma, minor (Age >= 65y); Neck trauma (Age >= 65y) EXAM: CT HEAD WITHOUT CONTRAST CT CERVICAL SPINE WITHOUT CONTRAST TECHNIQUE: Multidetector CT imaging of the head and cervical spine was performed following the standard protocol without intravenous contrast. Multiplanar CT image reconstructions of the cervical spine were also generated. RADIATION DOSE REDUCTION: This exam was performed according to the departmental dose-optimization program which includes automated exposure control, adjustment of the mA and/or kV according to patient size and/or use of iterative reconstruction technique. COMPARISON:  None Available. FINDINGS: CT HEAD FINDINGS Brain: No hemorrhage. No hydrocephalus. No extra-axial fluid collection. No mass effect. No mass lesion. No CT evidence of an acute cortical infarct. There is chronic infarcts in the left  frontal lobe with ex vacuo dilatation of the left lateral ventricular system. Vascular: No hyperdense vessel or unexpected calcification. Skull: Normal. Negative for fracture or focal lesion. Sinuses/Orbits: No middle ear or mastoid effusion. Paranasal sinuses are notable for frothy secretions in the left sphenoid sinus. Bilateral lens replacement. Orbits are otherwise unremarkable. Other: None. CT CERVICAL SPINE FINDINGS Alignment: Normal. Skull base and vertebrae: No acute fracture. No primary bone lesion or focal pathologic process. Soft tissues and spinal canal: No prevertebral fluid or swelling. No visible canal hematoma. Disc levels:  No CT evidence of high-grade spinal canal stenosis. Upper chest: Negative. Other: There is an age indeterminate minimal superior endplate compression deformity at T2. Correlate with point tenderness. IMPRESSION: 1. No CT evidence of intracranial injury. 2. No acute fracture or traumatic subluxation of the cervical spine. 3. Age indeterminate minimal superior endplate compression deformity at T2. Correlate with point tenderness. Electronically Signed   By: Lorenza Cambridge M.D.   On: 11/12/2023 10:12     Labs:   Basic Metabolic Panel: Recent Labs  Lab 11/14/23 0652 11/15/23 0504 11/17/23 0455 11/18/23 0535 11/19/23 0548  NA 144 145 151* 146* 146*  K 4.7 4.4 3.6 3.2* 4.3  CL 103 103 105 107 106  CO2 26 33* 27 31 31   GLUCOSE 115* 107* 129* 149* 102*  BUN 33* 34* 17 13 15   CREATININE 1.20* 1.01* 0.88 0.68 0.64  CALCIUM 8.6* 8.6* 8.9 8.3* 8.5*  MG 2.1  --   --   --   --    GFR Estimated Creatinine Clearance: 49.9 mL/min (by C-G formula based on SCr of 0.64 mg/dL). Liver Function  Tests: No results for input(s): "AST", "ALT", "ALKPHOS", "BILITOT", "PROT", "ALBUMIN" in the last 168 hours. No results for input(s): "LIPASE", "AMYLASE" in the last 168 hours. No results for input(s): "AMMONIA" in the last 168 hours. Coagulation profile No results for input(s):  "INR", "PROTIME" in the last 168 hours.  CBC: Recent Labs  Lab 11/14/23 0652 11/15/23 0504 11/17/23 0455 11/18/23 0535 11/19/23 0548  WBC 12.6* 9.5 8.1 8.6 6.7  NEUTROABS 9.9*  --   --   --   --   HGB 11.2* 8.8* 9.4* 8.6* 9.4*  HCT 35.7* 28.3* 31.1* 27.5* 29.9*  MCV 92.2 93.4 93.7 92.3 94.3  PLT 215 212 285 262 263   Cardiac Enzymes: No results for input(s): "CKTOTAL", "CKMB", "CKMBINDEX", "TROPONINI" in the last 168 hours. BNP: Invalid input(s): "POCBNP" CBG: Recent Labs  Lab 11/13/23 1641 11/13/23 2031 11/14/23 1057 11/14/23 1241 11/14/23 1629  GLUCAP 144* 136* 111* 130* 97   D-Dimer No results for input(s): "DDIMER" in the last 72 hours. Hgb A1c No results for input(s): "HGBA1C" in the last 72 hours. Lipid Profile No results for input(s): "CHOL", "HDL", "LDLCALC", "TRIG", "CHOLHDL", "LDLDIRECT" in the last 72 hours. Thyroid function studies No results for input(s): "TSH", "T4TOTAL", "T3FREE", "THYROIDAB" in the last 72 hours.  Invalid input(s): "FREET3" Anemia work up No results for input(s): "VITAMINB12", "FOLATE", "FERRITIN", "TIBC", "IRON", "RETICCTPCT" in the last 72 hours. Microbiology Recent Results (from the past 240 hours)  Surgical pcr screen     Status: Abnormal   Collection Time: 11/12/23  3:56 PM   Specimen: Nasal Mucosa; Nasal Swab  Result Value Ref Range Status   MRSA, PCR POSITIVE (A) NEGATIVE Final    Comment: RESULT CALLED TO, READ BACK BY AND VERIFIED WITH: RN L HUDSON [id number] AT 1352 BY CM    Staphylococcus aureus POSITIVE (A) NEGATIVE Final    Comment: (NOTE) The Xpert SA Assay (FDA approved for NASAL specimens in patients 95 years of age and older), is one component of a comprehensive surveillance program. It is not intended to diagnose infection nor to guide or monitor treatment. Performed at [facility], [address], [place], [place] [address]   Urine Culture (for pregnant, neutropenic or urologic patients or patients  with an indwelling urinary catheter)     Status: None   Collection Time: 11/14/23  8:43 AM   Specimen: Urine, Catheterized  Result Value Ref Range Status   Specimen Description URINE, CATHETERIZED  Final   Special Requests NONE  Final   Culture   Final    NO GROWTH Performed at [facility], [address], [place], [place] [address]    Report Status 11/15/2023 FINAL  Final     Discharge Instructions:   Discharge Instructions     Discharge instructions   Complete by: As directed    DSY 1 diet   Increase activity slowly   Complete by: As directed       Allergies as of 11/20/2023       Reactions   Erythromycin Hives, Rash   Aspirin    Other Other (See Comments)   Medication which has discolored lower extremity/feet.   Penicillins Hives   Lobster [shellfish Allergy] Hives, Rash        Medication List     STOP taking these medications    rosuvastatin 10 MG tablet Commonly known as: CRESTOR   torsemide 20 MG tablet Commonly known as: DEMADEX       TAKE these  medications    albuterol 0.63 MG/3ML nebulizer solution Commonly known as: ACCUNEB Take 1 ampule by nebulization every 4 (four) hours as needed for wheezing or shortness of breath.   CALMOSEPTINE EX Apply 1 Application topically 2 (two) times daily as needed (Rash).   carvedilol 12.5 MG tablet Commonly known as: COREG Take 12.5 mg by mouth 2 (two) times daily.   cyanocobalamin 1000 MCG tablet Commonly known as: VITAMIN B12 Take 1,000 mcg by mouth daily.   divalproex 125 MG capsule Commonly known as: DEPAKOTE SPRINKLE Take 125 mg by mouth 2 (two) times daily.   donepezil 10 MG tablet Commonly known as: ARICEPT Take 10 mg by mouth daily.   eucerin lotion Apply 1 Application topically 2 (two) times daily.   levETIRAcetam 500 MG tablet Commonly known as: KEPPRA Take 500 mg by mouth 2 (two) times daily.   LORazepam 0.5 MG tablet Commonly known as: ATIVAN Take 0.25 mg by mouth every  12 (twelve) hours.   morphine 20 MG/ML concentrated solution Commonly known as: ROXANOL Take 5 mg by mouth every hour as needed for severe pain (pain score 7-10) or shortness of breath.   NONFORMULARY OR COMPOUNDED ITEM Apply 1 mL topically 2 (two) times daily as needed (Agitation). Lorazepam 2mg /mL PLO Gel   nystatin cream Commonly known as: MYCOSTATIN Apply 1 Application topically 2 (two) times daily as needed for dry skin.   ondansetron 4 MG disintegrating tablet Commonly known as: ZOFRAN-ODT Take 4 mg by mouth every 6 (six) hours as needed for nausea or vomiting.   risperiDONE 0.5 MG tablet Commonly known as: RISPERDAL Take 0.5 mg by mouth at bedtime.   sertraline 50 MG tablet Commonly known as: ZOLOFT Take 75 mg by mouth daily.   Vitamin D (Cholecalciferol) 10 MCG (400 UNIT) Chew Chew 400 Units by mouth daily.        Follow-up Information     Myrene Galas, MD. Schedule an appointment as soon as possible for a visit in 2 day(s).   Specialty: Orthopedic Surgery Contact information: [address] [place] [address] [phone]                  Time coordinating discharge: 45 min  Signed:  Joseph Art DO  [facility] 11/20/2023, 12:05 PM

## 2023-11-20 NOTE — TOC Progression Note (Addendum)
Transition of Care [facility]) - Progression Note    Patient Details  Name: Patricia Donaldson MRN: [id number] Date of Birth: 1941/04/06  Transition of [facility] CM/SW Contact  Lorri Frederick, LCSW Phone Number: 11/20/2023, 10:42 AM  Clinical Narrative:   Message from [facility]: she has not been given approval for pt to return yet.     1150: message from [employer].  Pt has been approved to return.  They need hospital bed back in place and medication list.  MD informed.   CSW spoke with Angela/[facility] and she will work on bed delivery.   1300: DC summary emailed to Shem[facility]/[place].       Expected Discharge Plan and Services                                               Social Determinants of Health (SDOH) Interventions SDOH Screenings   Food Insecurity: No Food Insecurity (11/14/2023)  Housing: Patient Unable To Answer (11/14/2023)  Transportation Needs: No Transportation Needs (11/14/2023)  Utilities: Patient Unable To Answer (11/14/2023)  Financial Resource Strain: Low Risk  (10/16/2017)   Received from [facility], [facility]  Physical Activity: Unknown (10/16/2017)   Received from [facility], [facility]  Social Connections: Patient Unable To Answer (11/14/2023)  Stress: No Stress Concern Present (10/16/2017)   Received from [facility], [facility]  Tobacco Use: Medium Risk (11/13/2023)    Readmission Risk Interventions     No data to display

## 2023-11-20 NOTE — Progress Notes (Signed)
Daily Progress Note   Patient Name: Patricia Donaldson       Date: 11/20/2023 DOB: [birth year]  Age: 83 y.o. MRN#: [id number] Attending Physician: Joseph Art, DO Primary Care Physician: Smiley Houseman, NP Admit Date: 11/12/2023  Reason for Consultation/Follow-up: Non pain symptom management, Pain control, Psychosocial/spiritual support, and Terminal Care  Subjective: I have reviewed medical records including EPIC notes, MAR, any available advanced directives as necessary, and labs. Received report from primary RN - no acute concerns.  Went to visit patient at bedside - no family/visitors present. Patient was lying in bed asleep - I did not attempt to wake her to preserve comfort. No signs or non-verbal gestures of pain or discomfort noted. No respiratory distress, increased work of breathing, or secretions noted. She is on 3L O2 [place].  Per TOC notes, patient was evaluated by [facility] on 1/22 and met criteria for return. Facility leadership to finalize. Plan is for discharge back to facility with hospice.  Length of Stay: 8  Current Medications: Scheduled Meds:   carvedilol  12.5 mg Oral BID   Chlorhexidine Gluconate Cloth  6 each Topical Q0600   cholecalciferol  400 Units Oral Daily   cyanocobalamin  1,000 mcg Oral Daily   divalproex  125 mg Oral BID   docusate sodium  100 mg Oral BID   donepezil  10 mg Oral Daily   enoxaparin (LOVENOX) injection  40 mg Subcutaneous Q24H   feeding supplement  237 mL Oral BID BM   fentaNYL (SUBLIMAZE) injection  25 mcg Intravenous Once   levETIRAcetam  500 mg Oral BID   LORazepam  0.25 mg Oral Q12H   mupirocin ointment  1 Application Nasal BID   risperiDONE  0.5 mg Oral QHS   sertraline  75 mg Oral Daily   sodium chloride flush  3 mL Intravenous  Q12H    Continuous Infusions:   PRN Meds: albuterol, menthol-cetylpyridinium **OR** phenol, metoCLOPramide **OR** metoCLOPramide (REGLAN) injection, morphine CONCENTRATE, ondansetron, polyethylene glycol  Physical Exam Vitals and nursing note reviewed.  Constitutional:      General: She is not in acute distress.    Appearance: She is ill-appearing.  Pulmonary:     Effort: No respiratory distress.  Skin:    General: Skin is warm and dry.  Neurological:     Comments:  asleep             Vital Signs: BP 115/80 (BP Location: Right Arm)   Pulse 64   Temp 98.9 F (37.2 C) (Oral)   Resp 18   Ht 5\' 4"  (1.626 m)   Wt 68.9 kg   SpO2 100%   BMI 26.07 kg/m  SpO2: SpO2: 100 % O2 Device: O2 Device: Nasal Cannula O2 Flow Rate: O2 Flow Rate (L/min): 3 L/min  Intake/output summary:  Intake/Output Summary (Last 24 hours) at 11/20/2023 2841 Last data filed at 11/20/2023 0500 Gross per 24 hour  Intake 60 ml  Output 551 ml  Net -491 ml   LBM: Last BM Date : 11/19/23 Baseline Weight: Weight: 68.9 kg Most recent weight: Weight: 68.9 kg       Palliative Assessment/Data: PPS 50%      Patient Active Problem List   Diagnosis Date Noted   Hypernatremia 11/17/2023   Dementia with behavioral disturbance (HCC) 11/13/2023   Anxiety 11/13/2023   Fall at home, initial encounter 11/12/2023   Femoral fracture (HCC) 11/12/2023   Compression fracture of body of thoracic vertebra (HCC) 11/12/2023   Seizure (HCC) 11/12/2023   Fall 11/12/2023   Urinary frequency 10/02/2018   Thyroid disease 03/10/2017   Bleeding nose 01/13/2017   Subacromial bursitis of left shoulder joint 12/19/2016   Left shoulder pain 12/19/2016   Atrial fibrillation, chronic (HCC) 01/02/2016   Moderate tricuspid insufficiency 01/03/2015   Chronic pulmonary hypertension (HCC) 01/03/2015   Moderate mitral insufficiency 08/10/2014   Stroke (HCC) 05/25/2014   Venous stasis of lower extremity 05/20/2011   TIA on  medication 05/20/2011   Osteopenia 05/20/2011   Hyperlipidemia 05/20/2011   Hypertension 05/20/2011    Palliative Care Assessment & Plan   Patient Profile: 83 y.o. female with past medical history of dementia, HLD, HTN, A-fib, seizure, and anxiety admitted on 11/12/2023 with a fall at her facility and found to have femur fracture. Patient was on hospice services prior to admission at her memory care unit. PMT consulted to discuss goals of care.   Assessment: Principal Problem:   Fall at home, initial encounter Active Problems:   Hyperlipidemia   Hypertension   Atrial fibrillation, chronic (HCC)   Femoral fracture (HCC)   Compression fracture of body of thoracic vertebra (HCC)   Seizure (HCC)   Fall   Dementia with behavioral disturbance (HCC)   Anxiety   Hypernatremia   Terminal care  Recommendations/Plan: All care should be aimed at patient's comfort Continue DNR/DNI as previously documented  Goal is for discharge back to facility with hospice once approved  Continue current comfort focused medication regimen - no changes PMT will continue to follow peripherally and visit incrementally. If there are any imminent needs please call the service directly   Symptom Management Morphine concentrate PRN pain/shortness of breath Scheduled ativan  Goals of Care and Additional Recommendations: Limitations on Scope of Treatment: Full Comfort Care  Code Status:    Code Status Orders  (From admission, onward)           Start     Ordered   11/12/23 1306  Do not attempt resuscitation (DNR) - Comfort care  Continuous       Question Answer Comment  If patient has no pulse and is not breathing Do Not Attempt Resuscitation   In Pre-Arrest Conditions (Patient Is Breathing and Has a Pulse) Provide comfort measures. Relieve any mechanical airway obstruction. Avoid transfer unless required for comfort.   Consent:  Discussion documented in EHR or advanced directives reviewed       11/12/23 1305           Code Status History     This patient has a current code status but no historical code status.      Advance Directive Documentation    Flowsheet Row Most Recent Value  Type of Advance Directive Healthcare Power of Attorney, Out of facility DNR (pink MOST or yellow form)  Pre-existing out of facility DNR order (yellow form or pink MOST form) --  "MOST" Form in Place? --       Prognosis:  < 6 months  Discharge Planning: Skilled Nursing Facility with Hospice  Care plan was discussed with primary RN  Thank you for allowing the Palliative Medicine Team to assist in the care of this patient.     Haskel Khan, NP  Please contact Palliative Medicine Team phone at [phone] for questions and concerns.   *Portions of this note are a verbal dictation therefore any spelling and/or grammatical errors are due to the "Dragon Medical One" system interpretation.

## 2023-11-20 NOTE — Plan of Care (Signed)

## 2023-11-21 ENCOUNTER — Emergency Department (HOSPITAL_BASED_OUTPATIENT_CLINIC_OR_DEPARTMENT_OTHER)
Admission: EM | Admit: 2023-11-21 | Discharge: 2023-11-21 | Disposition: A | Discharge status: Home / Self Care | Payer: Medicare Other | Attending: Emergency Medicine | Admitting: Emergency Medicine

## 2023-11-21 ENCOUNTER — Other Ambulatory Visit: Payer: Self-pay

## 2023-11-21 DIAGNOSIS — Z9981 Dependence on supplemental oxygen: Secondary | ICD-10-CM | POA: Diagnosis present

## 2023-11-21 DIAGNOSIS — Z8673 Personal history of transient ischemic attack (TIA), and cerebral infarction without residual deficits: Secondary | ICD-10-CM | POA: Insufficient documentation

## 2023-11-21 DIAGNOSIS — I10 Essential (primary) hypertension: Secondary | ICD-10-CM | POA: Diagnosis not present

## 2023-11-21 DIAGNOSIS — Z87891 Personal history of nicotine dependence: Secondary | ICD-10-CM | POA: Diagnosis not present

## 2023-11-21 MED ORDER — LORAZEPAM 0.5 MG PO TABS: 0.2500 mg | ORAL_TABLET | Freq: Two times a day (BID) | ORAL | 0 refills | Status: AC

## 2023-11-21 MED ORDER — MORPHINE SULFATE (CONCENTRATE) 20 MG/ML PO SOLN: 5.0000 mg | ORAL | 0 refills | Status: DC | PRN

## 2023-11-21 MED ORDER — LORAZEPAM 0.5 MG PO TABS: 0.2500 mg | ORAL_TABLET | Freq: Two times a day (BID) | ORAL | 0 refills | Status: DC

## 2023-11-21 MED ORDER — MORPHINE SULFATE (CONCENTRATE) 20 MG/ML PO SOLN: 5.0000 mg | ORAL | 0 refills | Status: AC | PRN

## 2023-11-21 NOTE — ED Notes (Signed)
Warm blankets applied to patient for temp regulation

## 2023-11-21 NOTE — ED Provider Notes (Signed)
[place] EMERGENCY DEPARTMENT AT [facility] Provider Note   CSN: [id number] Arrival date & time: 11/21/23  1215     History  Chief Complaint  Patient presents with   Oxygen Need    Patricia Donaldson is a 83 y.o. female.  HPI   Patient has a history of multiple medical problems including TIA hypertension atrial fibrillation heart disease and recent hospitalization for fall at home resulting in a femoral fracture.  Patient was admitted to [facility] on January 15.  Patient was just discharged from the hospital today at [facility].  Patient was to be discharged to a nursing facility to resume hospice.  Per the notes the patient's son indicated patient ultimately wanted palliative comfort measures.  She did have IM nail placed for comfort palliative measures.  While in the hospital patient had poor p.o. intake.  Patient went to the nursing facility today.  They did not have her oxygen available so she was returned to the ED. patient denies any complaints  Home Medications Prior to Admission medications   Medication Sig Start Date End Date Taking? Authorizing Provider  albuterol (ACCUNEB) 0.63 MG/3ML nebulizer solution Take 1 ampule by nebulization every 4 (four) hours as needed for wheezing or shortness of breath.    [provider]  carvedilol (COREG) 12.5 MG tablet Take 12.5 mg by mouth 2 (two) times daily.    [provider]  divalproex (DEPAKOTE SPRINKLE) 125 MG capsule Take 125 mg by mouth 2 (two) times daily. 05/28/23   [provider]  donepezil (ARICEPT) 10 MG tablet Take 10 mg by mouth daily.    [provider]  Emollient (EUCERIN) lotion Apply 1 Application topically 2 (two) times daily.    [provider]  levETIRAcetam (KEPPRA) 500 MG tablet Take 500 mg by mouth 2 (two) times daily.    [provider]  LORazepam (ATIVAN) 0.5 MG tablet Take 0.5 tablets (0.25 mg total) by mouth every 12 (twelve) hours. 11/21/23    Joseph Art, DO  Menthol-Zinc Oxide (CALMOSEPTINE EX) Apply 1 Application topically 2 (two) times daily as needed (Rash).    [provider]  morphine (ROXANOL) 20 MG/ML concentrated solution Take 0.25 mLs (5 mg total) by mouth every hour as needed for severe pain (pain score 7-10) or shortness of breath. 11/21/23   Joseph Art, DO  NONFORMULARY OR COMPOUNDED ITEM Apply 1 mL topically 2 (two) times daily as needed (Agitation). Lorazepam 2mg /mL PLO Gel    [provider]  nystatin cream (MYCOSTATIN) Apply 1 Application topically 2 (two) times daily as needed for dry skin.    [provider]  ondansetron (ZOFRAN-ODT) 4 MG disintegrating tablet Take 4 mg by mouth every 6 (six) hours as needed for nausea or vomiting.    [provider]  risperiDONE (RISPERDAL) 0.5 MG tablet Take 0.5 mg by mouth at bedtime.    [provider]  sertraline (ZOLOFT) 50 MG tablet Take 75 mg by mouth daily.    [provider]  torsemide (DEMADEX) 20 MG tablet Take 80 mg by mouth daily.    [provider]  vitamin B-12 (CYANOCOBALAMIN) 1000 MCG tablet Take 1,000 mcg by mouth daily.    [provider]  Vitamin D, Cholecalciferol, 10 MCG (400 UNIT) CHEW Chew 400 Units by mouth daily.    [provider]      Allergies    Erythromycin, Aspirin, Other, Penicillins, and Lobster [shellfish allergy]  Review of Systems   Review of Systems  Physical Exam Updated Vital Signs BP 98/72   Pulse 76   Temp 97.6 F (36.4 C) (Oral)   Resp 16   SpO2 100%  Physical Exam Vitals and nursing note reviewed.  Constitutional:      Appearance: She is well-developed. She is not diaphoretic.     Comments: Sleeping  HENT:     Head: Normocephalic and atraumatic.     Right Ear: External ear normal.     Left Ear: External ear normal.  Eyes:     General: No scleral icterus.       Right eye: No discharge.        Left eye: No discharge.      Conjunctiva/sclera: Conjunctivae normal.  Neck:     Trachea: No tracheal deviation.  Cardiovascular:     Rate and Rhythm: Normal rate.  Pulmonary:     Effort: Pulmonary effort is normal. No respiratory distress.     Breath sounds: No stridor.  Abdominal:     General: There is no distension.  Musculoskeletal:     Cervical back: Neck supple.  Skin:    General: Skin is warm and dry.     Findings: No rash.  Neurological:     Mental Status: Mental status is at baseline.     Cranial Nerves: No dysarthria or facial asymmetry.     Motor: No seizure activity.     ED Results / Procedures / Treatments   Labs (all labs ordered are listed, but only abnormal results are displayed) Labs Reviewed - No data to display  EKG None  Radiology No results found.  Procedures Procedures    Medications Ordered in ED Medications - No data to display  ED Course/ Medical Decision Making/ A&P                                 Medical Decision Making  I records reviewed.  Patient was just discharged from [facility].  She is under palliative comfort measures.  She was returned because the oxygen was not ready at the facility.  There is a note in the chart from [place] today that oxygen was ordered but it may not be available until 5 PM.  Will plan on holding patient in the ED until oxygen is available at her facility.  Will not plan on repeat additional workup at this time.  Patient denies any complaints at this time.  Patient's son was contacted.  Social work has been involved to make sure all appropriate equipment is available at the facility before we transport her        Final Clinical Impression(s) / ED Diagnoses Final diagnoses:  Oxygen dependent    Rx / DC Orders ED Discharge Orders     None         Linwood Dibbles, MD 11/21/23 1534

## 2023-11-21 NOTE — ED Notes (Signed)
[facility] called for transport 14:56

## 2023-11-21 NOTE — ED Notes (Signed)
Notified Garner Nash, social worker at [phone] that patient is currently located at our facility. Also attempted to call and notify [facility] at [phone], no answer, left voicemail.

## 2023-11-21 NOTE — Progress Notes (Signed)
Updated FL2 emailed to [facility], [place] confirmed they are good to receive pt back.  O2 is there. Daleen Squibb, MSW, LCSW 1/24/20253:17 PM

## 2023-11-21 NOTE — Progress Notes (Signed)
TC [facility].  Pt arrived with [facility] on O2, they do not have O2 available and therefore pt is returning to [facility].  CSW spoke with Angela/[facility].  She did order O2 but may not be delivered until 5pm. Daleen Squibb, MSW, LCSW 1/24/202512:11 PM

## 2023-11-21 NOTE — TOC Transition Note (Signed)
Transition of Care [facility]) - Discharge Note   Patient Details  Name: Patricia Donaldson MRN: [id number] Date of Birth: 07/24/1941  Transition of Care [facility]) CM/SW Contact:  Lorri Frederick, LCSW Phone Number: 11/21/2023, 10:38 AM   Clinical Narrative:   Pt discharged to [facility] memory care with [facility].  RN call report to [phone].    Final next level of care: [facility]isted Living [facility] memory care) Barriers to Discharge: Barriers Resolved   Patient Goals and CMS Choice            Discharge Placement              [facility]:  [facility] Patient to be transferred to facility by: [facility] Name of family member notified: son Renae Fickle Patient and family notified of of transfer: 11/21/23  Discharge Plan and Services Additional resources added to the After Visit Summary for                                       Social Drivers of Health (SDOH) Interventions SDOH Screenings   Food Insecurity: No Food Insecurity (11/14/2023)  Housing: Patient Unable To Answer (11/14/2023)  Transportation Needs: No Transportation Needs (11/14/2023)  Utilities: Patient Unable To Answer (11/14/2023)  Financial Resource Strain: Low Risk  (10/16/2017)   Received from [facility], [facility]  Physical Activity: Unknown (10/16/2017)   Received from [facility], [facility]  Social Connections: Patient Unable To Answer (11/14/2023)  Stress: No Stress Concern Present (10/16/2017)   Received from [facility], [facility]  Tobacco Use: Medium Risk (11/13/2023)     Readmission Risk Interventions     No data to display

## 2023-11-21 NOTE — ED Notes (Signed)
Unable to complete full triage d/t pts loc.

## 2023-11-21 NOTE — ED Notes (Signed)
Notified Marylene Land with [facility] @ [phone] that patient is currently at our facility.

## 2023-11-21 NOTE — Progress Notes (Signed)
Report given to [facility]. Pt transports via [facility].

## 2023-11-21 NOTE — Discharge Instructions (Signed)
Continue the treatment as recommended by the doctors who took care of you while you are hospitalized

## 2023-11-21 NOTE — ED Notes (Signed)
Hospice nurse Kathie Rhodes called to check on patient. Left number [phone] to call when patient was transported to facility.

## 2023-11-21 NOTE — NC FL2 (Addendum)
[place] MEDICAID FL2 LEVEL OF CARE FORM     IDENTIFICATION  Patient Name: Patricia Donaldson Birthdate: 1941-09-08 Sex: female Admission Date (Current Location): 11/21/2023  [facility] [place] Number:  Producer, television/film/video and Address:  [facility], [address], [place], [place] [address]      Provider Number: [id number]  Attending Physician Name and Address:  Linwood Dibbles, MD  Relative Name and Phone Number:  [place], [place], son [phone]    Current Level of Care: Hospital Recommended Level of Care: Other (Comment) ([facility]) Prior Approval Number:    Date Approved/Denied:   PASRR Number:    Discharge Plan: Other (Comment) ([facility])    Current Diagnoses: Patient Active Problem List   Diagnosis Date Noted   Hypernatremia 11/17/2023   Dementia with behavioral disturbance (HCC) 11/13/2023   Anxiety 11/13/2023   Fall at home, initial encounter 11/12/2023   Femoral fracture (HCC) 11/12/2023   Compression fracture of body of thoracic vertebra (HCC) 11/12/2023   Seizure (HCC) 11/12/2023   Fall 11/12/2023   Urinary frequency 10/02/2018   Thyroid disease 03/10/2017   Bleeding nose 01/13/2017   Subacromial bursitis of left shoulder joint 12/19/2016   Left shoulder pain 12/19/2016   Atrial fibrillation, chronic (HCC) 01/02/2016   Moderate tricuspid insufficiency 01/03/2015   Chronic pulmonary hypertension (HCC) 01/03/2015   Moderate mitral insufficiency 08/10/2014   Stroke (HCC) 05/25/2014   Venous stasis of lower extremity 05/20/2011   TIA on medication 05/20/2011   Osteopenia 05/20/2011   Hyperlipidemia 05/20/2011   Hypertension 05/20/2011    Orientation RESPIRATION BLADDER Height & Weight     Self  O2 (3L) Incontinent Weight:   Height:     BEHAVIORAL SYMPTOMS/MOOD NEUROLOGICAL BOWEL NUTRITION STATUS    Convulsions/Seizures Incontinent Diet (regular diet)  AMBULATORY STATUS COMMUNICATION OF NEEDS  Skin   Total Care Verbally Surgical wounds                       Personal Care Assistance Level of Assistance  Total care, Dressing, Feeding, Bathing Bathing Assistance: Maximum assistance Feeding assistance: Maximum assistance Dressing Assistance: Maximum assistance Total Care Assistance: Maximum assistance   Functional Limitations Info  Sight, Hearing, Speech Sight Info: Adequate Hearing Info: Adequate Speech Info: Adequate    SPECIAL CARE FACTORS FREQUENCY                       Contractures Contractures Info: Not present    Additional Factors Info  Code Status, Allergies Code Status Info: DNR Allergies Info: Erythromycin, Aspirin, Other, Penicillins, Lobster (Shellfish Allergy)           Current Medications (11/21/2023):  This is the current hospital active medication list No current facility-administered medications for this encounter.   Current Outpatient Medications  Medication Sig Dispense Refill   albuterol (ACCUNEB) 0.63 MG/3ML nebulizer solution Take 1 ampule by nebulization every 4 (four) hours as needed for wheezing or shortness of breath.     carvedilol (COREG) 12.5 MG tablet Take 12.5 mg by mouth 2 (two) times daily.     divalproex (DEPAKOTE SPRINKLE) 125 MG capsule Take 125 mg by mouth 2 (two) times daily.     donepezil (ARICEPT) 10 MG tablet Take 10 mg by mouth daily.     Emollient (EUCERIN) lotion Apply 1 Application topically 2 (two) times daily.     levETIRAcetam (KEPPRA) 500 MG tablet Take 500 mg by  mouth 2 (two) times daily.     LORazepam (ATIVAN) 0.5 MG tablet Take 0.5 tablets (0.25 mg total) by mouth every 12 (twelve) hours. 4 tablet 0   Menthol-Zinc Oxide (CALMOSEPTINE EX) Apply 1 Application topically 2 (two) times daily as needed (Rash).     morphine (ROXANOL) 20 MG/ML concentrated solution Take 0.25 mLs (5 mg total) by mouth every hour as needed for severe pain (pain score 7-10) or shortness of breath. 15 mL 0   NONFORMULARY OR  COMPOUNDED ITEM Apply 1 mL topically 2 (two) times daily as needed (Agitation). Lorazepam 2mg /mL PLO Gel     nystatin cream (MYCOSTATIN) Apply 1 Application topically 2 (two) times daily as needed for dry skin.     ondansetron (ZOFRAN-ODT) 4 MG disintegrating tablet Take 4 mg by mouth every 6 (six) hours as needed for nausea or vomiting.     risperiDONE (RISPERDAL) 0.5 MG tablet Take 0.5 mg by mouth at bedtime.     sertraline (ZOLOFT) 50 MG tablet Take 75 mg by mouth daily.     [Paused] torsemide (DEMADEX) 20 MG tablet Take 80 mg by mouth daily.     vitamin B-12 (CYANOCOBALAMIN) 1000 MCG tablet Take 1,000 mcg by mouth daily.     Vitamin D, Cholecalciferol, 10 MCG (400 UNIT) CHEW Chew 400 Units by mouth daily.       Discharge Medications: Please see discharge summary for a list of discharge medications.  Relevant Imaging Results:  Relevant Lab Results:   Additional Information SSN: [id number].  Pt will continue comfort care services with [facility].  Lorri Frederick, LCSW

## 2023-11-21 NOTE — ED Notes (Signed)
Notified patient's son, Shiara Mcgough, @ [phone] that patient was currently at our facility awaiting oxygen delivery to [facility]. Marland Kitchen

## 2023-11-21 NOTE — Plan of Care (Signed)
  Problem: Education: Goal: Knowledge of General Education information will improve Description: Including pain rating scale, medication(s)/side effects and non-pharmacologic comfort measures Outcome: Progressing   Problem: Pain Managment: Goal: General experience of comfort will improve and/or be controlled Outcome: Progressing   Problem: Safety: Goal: Ability to remain free from injury will improve Outcome: Progressing

## 2023-11-21 NOTE — TOC Progression Note (Signed)
Transition of Care [facility]) - Progression Note    Patient Details  Name: Patricia Donaldson MRN: [id number] Date of Birth: 07-29-1941  [facility] CM/SW Contact  Lorri Frederick, LCSW Phone Number: 11/21/2023, 10:36 AM  Clinical Narrative:   CSW received message from [facility]: hospital bed has been delivered, good to go.  CSW informed [facility]--she reports no specific transport company as pt actually stopped her hospice services upon admission.          Expected Discharge Plan and Services         Expected Discharge Date: 11/21/23                                     Social Determinants of Health (SDOH) Interventions SDOH Screenings   Food Insecurity: No Food Insecurity (11/14/2023)  Housing: Patient Unable To Answer (11/14/2023)  Transportation Needs: No Transportation Needs (11/14/2023)  Utilities: Patient Unable To Answer (11/14/2023)  Financial Resource Strain: Low Risk  (10/16/2017)   Received from [facility], [facility]  Physical Activity: Unknown (10/16/2017)   Received from [facility], [facility]  Social Connections: Patient Unable To Answer (11/14/2023)  Stress: No Stress Concern Present (10/16/2017)   Received from [facility], [facility]  Tobacco Use: Medium Risk (11/13/2023)    Readmission Risk Interventions     No data to display

## 2023-11-21 NOTE — ED Triage Notes (Signed)
Pt bib [facility], reports d/t no o2 availability at [facility], brought pt to DB for o2 needs until o2 avail at [facility]. Pt recent d/c from [facility] after femur fx

## 2023-11-21 NOTE — ED Notes (Signed)
Called [facility] at 18:32 to follow up on transportation.  [facility] stated that pt was not added to the queue until 15:45 and states pt is third in line for pick up.

## 2023-11-21 NOTE — [place] FL2 (Deleted)
[place] MEDICAID FL2 LEVEL OF CARE FORM     IDENTIFICATION  Patient Name: Patricia Donaldson Birthdate: 1941/04/14 Sex: female Admission Date (Current Location): 11/21/2023  [facility] and [place] Number:  Producer, television/film/video and Address:  The [place], [address], [place], [place] [address]      Provider Number: [id number]  Attending Physician Name and Address:  Linwood Dibbles, MD  Relative Name and Phone Number:  Ember, Henrikson, son [phone]    Current Level of Care: Hospital Recommended Level of Care: Other (Comment) ([facility]) Prior Approval Number:    Date Approved/Denied:   PASRR Number:    Discharge Plan: Other (Comment) ([facility])    Current Diagnoses: Patient Active Problem List   Diagnosis Date Noted   Hypernatremia 11/17/2023   Dementia with behavioral disturbance (HCC) 11/13/2023   Anxiety 11/13/2023   Fall at home, initial encounter 11/12/2023   Femoral fracture (HCC) 11/12/2023   Compression fracture of body of thoracic vertebra (HCC) 11/12/2023   Seizure (HCC) 11/12/2023   Fall 11/12/2023   Urinary frequency 10/02/2018   Thyroid disease 03/10/2017   Bleeding nose 01/13/2017   Subacromial bursitis of left shoulder joint 12/19/2016   Left shoulder pain 12/19/2016   Atrial fibrillation, chronic (HCC) 01/02/2016   Moderate tricuspid insufficiency 01/03/2015   Chronic pulmonary hypertension (HCC) 01/03/2015   Moderate mitral insufficiency 08/10/2014   Stroke (HCC) 05/25/2014   Venous stasis of lower extremity 05/20/2011   TIA on medication 05/20/2011   Osteopenia 05/20/2011   Hyperlipidemia 05/20/2011   Hypertension 05/20/2011    Orientation RESPIRATION BLADDER Height & Weight     Self  Normal (3L) Incontinent Weight:   Height:     BEHAVIORAL SYMPTOMS/MOOD NEUROLOGICAL BOWEL NUTRITION STATUS    Convulsions/Seizures Incontinent Diet (regular diet)  AMBULATORY STATUS COMMUNICATION OF NEEDS  Skin   Total Care Verbally Surgical wounds                       Personal Care Assistance Level of Assistance  Total care, Dressing, Feeding, Bathing Bathing Assistance: Maximum assistance Feeding assistance: Maximum assistance Dressing Assistance: Maximum assistance Total Care Assistance: Maximum assistance   Functional Limitations Info  Sight, Hearing, Speech Sight Info: Adequate Hearing Info: Adequate Speech Info: Adequate    SPECIAL CARE FACTORS FREQUENCY                       Contractures Contractures Info: Not present    Additional Factors Info  Code Status, Allergies Code Status Info: DNR Allergies Info: Erythromycin, Aspirin, Other, Penicillins, Lobster (Shellfish Allergy)           Current Medications (11/21/2023):  This is the current hospital active medication list No current facility-administered medications for this encounter.   Current Outpatient Medications  Medication Sig Dispense Refill   albuterol (ACCUNEB) 0.63 MG/3ML nebulizer solution Take 1 ampule by nebulization every 4 (four) hours as needed for wheezing or shortness of breath.     carvedilol (COREG) 12.5 MG tablet Take 12.5 mg by mouth 2 (two) times daily.     divalproex (DEPAKOTE SPRINKLE) 125 MG capsule Take 125 mg by mouth 2 (two) times daily.     donepezil (ARICEPT) 10 MG tablet Take 10 mg by mouth daily.     Emollient (EUCERIN) lotion Apply 1 Application topically 2 (two) times daily.     levETIRAcetam (KEPPRA) 500 MG tablet Take 500 mg by  mouth 2 (two) times daily.     LORazepam (ATIVAN) 0.5 MG tablet Take 0.5 tablets (0.25 mg total) by mouth every 12 (twelve) hours. 4 tablet 0   Menthol-Zinc Oxide (CALMOSEPTINE EX) Apply 1 Application topically 2 (two) times daily as needed (Rash).     morphine (ROXANOL) 20 MG/ML concentrated solution Take 0.25 mLs (5 mg total) by mouth every hour as needed for severe pain (pain score 7-10) or shortness of breath. 15 mL 0   NONFORMULARY OR  COMPOUNDED ITEM Apply 1 mL topically 2 (two) times daily as needed (Agitation). Lorazepam 2mg /mL PLO Gel     nystatin cream (MYCOSTATIN) Apply 1 Application topically 2 (two) times daily as needed for dry skin.     ondansetron (ZOFRAN-ODT) 4 MG disintegrating tablet Take 4 mg by mouth every 6 (six) hours as needed for nausea or vomiting.     risperiDONE (RISPERDAL) 0.5 MG tablet Take 0.5 mg by mouth at bedtime.     sertraline (ZOLOFT) 50 MG tablet Take 75 mg by mouth daily.     [Paused] torsemide (DEMADEX) 20 MG tablet Take 80 mg by mouth daily.     vitamin B-12 (CYANOCOBALAMIN) 1000 MCG tablet Take 1,000 mcg by mouth daily.     Vitamin D, Cholecalciferol, 10 MCG (400 UNIT) CHEW Chew 400 Units by mouth daily.       Discharge Medications: Please see discharge summary for a list of discharge medications.  Relevant Imaging Results:  Relevant Lab Results:   Additional Information SSN: [id number].  Pt will continue comfort care services with [facility].  Lorri Frederick, LCSW

## 2023-11-21 NOTE — ED Notes (Addendum)
Spoke with DON at [facility], she need FL2 updated to state pt has 3L o2 requirement so pt can be transferred. O2 is avail at facility. LM for Greg, TOC

## 2023-11-21 NOTE — Discharge Summary (Signed)
Physician Discharge Summary  Patricia Donaldson ZOX:[id number] DOB: Jan 30, 1941 DOA: 11/12/2023  PCP: Smiley Houseman, NP  Admit date: 11/12/2023 Discharge date: 11/21/2023     Recommendations for Outpatient Follow-Up:   Resume hospice Diuretics held for now-- monitor for need for resumption HAS GOTTEN ZERO DOSES OF PAIN MEDS IN LAST 72 hours   Discharge Diagnosis:   Principal Problem:   Fall at home, initial encounter Active Problems:   Femoral fracture (HCC)   Hyperlipidemia   Hypertension   Atrial fibrillation, chronic (HCC)   Compression fracture of body of thoracic vertebra (HCC)   Seizure (HCC)   Fall   Dementia with behavioral disturbance (HCC)   Anxiety   Hypernatremia    Discharge Condition: Improved.  Diet recommendation:DYS    Code status: DNR   History of Present Illness:   Patient is a 83 year old female history of dementia and memory care unit at a nursing home and presented to the ED with an unwitnessed fall with inability to get up. Imaging done concerning for left femoral fracture. Orthopedics consulted and patient for IM nail today. Admitting physician discussed with patient's son, Keniesha Adderly over the telephone who clearly advised patient to receive only palliative/comfort measures. Patient would not want any treatment with curative intent unless comfort factors overwhelming.    Hospital Course by Problem:    acute left commuted intertrochanteric fracture of proximal left femur with varus angulation -Secondary to mechanical fall. -Patient seen in consultation by orthopedics and patient status post IM nail of left hip per orthopedics: Dr. Carola Frost 11/13/2023. -IM nail placed for palliation. -Continue current pain management. -Will need outpatient follow-up with orthopedics.    Seizure disorder -Stable. -No seizures noted. -Continue Depakote sprinkles, Keppra.     Compression fracture of the body of the thoracic vertebrae -Likely  chronic. -Continue current pain management.    Hypertension -BP was soft initially however has improved and as such Coreg resumed.     Dementia -Continue Depakote sprinkles, Aricept, risperidone.    Hyperlipidemia -Continue to hold statin and may consider discontinuation on discharge at this is noted that patient was being followed in the outpatient setting by hospice.    Anxiety -Continue Zoloft, Ativan.      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 -BP improved and as such Coreg has been resumed.     Dehydration -Patient with dry mucous membranes on exam initially looked dehydrated. -Diuretics discontinued. -Patient with poor oral intake.        Postop acute blood loss anemia -Hemoglobin currently at 8.6 from 9.4 from 8.8 from 13.0 on admission. -Patient with no overt bleeding. -Follow H&H. -Transfusion threshold hemoglobin < 8.    Hypernatremia -Likely secondary to dehydration. -Improved on D5W. -Continue to hold diuretics.   Hypokalemia -Repleted      Medical Consultants:      Discharge Exam:   Vitals:   11/21/23 0422 11/21/23 0809  BP: 107/82 (!) 135/90  Pulse: 78 82  Resp: 16 18  Temp: 98.5 F (36.9 C) (!) 97.4 F (36.3 C)  SpO2: 100% 100%   Vitals:   11/20/23 2006 11/20/23 2158 11/21/23 0422 11/21/23 0809  BP: 108/74 116/86 107/82 (!) 135/90  Pulse: 65 64 78 82  Resp: 16  16 18   Temp: (!) 97.5 F (36.4 C)  98.5 F (36.9 C) (!) 97.4 F (36.3 C)  TempSrc: Oral  Oral Axillary  SpO2: 100%  100% 100%  Weight:      Height:        General exam: Appears calm and comfortable.    The results of significant diagnostics from this hospitalization (including imaging, microbiology, ancillary and laboratory) are listed below for reference.     Procedures and Diagnostic Studies:   DG FEMUR PORT MIN 2 VIEWS  LEFT Result Date: 11/13/2023 CLINICAL DATA:  Fracture, postop. EXAM: LEFT FEMUR PORTABLE 2 VIEWS COMPARISON:  Preoperative imaging. FINDINGS: Femoral intramedullary nail with trans trochanteric and distal locking screw fixation traverse proximal femur fracture. Improved fracture alignment from preoperative imaging. Recent postsurgical change includes air and edema in the soft tissues. IMPRESSION: ORIF proximal femur fracture. Electronically Signed   By: Narda Rutherford M.D.   On: 11/13/2023 15:06   DG FEMUR MIN 2 VIEWS LEFT Result Date: 11/13/2023 CLINICAL DATA:  Elective surgery. EXAM: LEFT FEMUR 2 VIEWS COMPARISON:  Preoperative imaging FINDINGS: Six fluoroscopic spot views of the left femur obtained in the operating room. Femoral intramedullary nail with trans trochanteric and distal locking screw fixation traverse proximal femur fracture. Fluoroscopy time 83.2 seconds. Dose 23.62 mGy. IMPRESSION: Intraoperative fluoroscopy during proximal femur fracture fixation. Electronically Signed   By: Narda Rutherford M.D.   On: 11/13/2023 15:05   DG C-Arm 1-60 Min-No Report Result Date: 11/13/2023 Fluoroscopy was utilized by the requesting physician.  No radiographic interpretation.   DG Pelvis Portable Result Date: 11/12/2023 CLINICAL DATA:  Fall EXAM: PORTABLE PELVIS 1-2 VIEWS; LEFT FEMUR PORTABLE 2 VIEWS COMPARISON:  None Available. FINDINGS: Acute, comminuted intertrochanteric fracture of the proximal left femur with approximately 90 degrees of varus angulation. Hip joint alignment is maintained without dislocation. Bony pelvis intact without evidence of fracture or diastasis. Knee joint alignment maintained with medial compartment joint space narrowing. IMPRESSION: Acute, comminuted intertrochanteric fracture of the proximal left femur with varus angulation. Electronically Signed   By: Duanne Guess D.O.   On: 11/12/2023 11:05   DG FEMUR PORT MIN 2 VIEWS LEFT Result Date: 11/12/2023 CLINICAL DATA:   Fall EXAM: PORTABLE PELVIS 1-2 VIEWS; LEFT FEMUR PORTABLE 2 VIEWS COMPARISON:  None Available. FINDINGS: Acute, comminuted intertrochanteric fracture of the proximal left femur with approximately 90 degrees of varus angulation. Hip joint alignment is maintained without dislocation. Bony pelvis intact without evidence of fracture or diastasis. Knee joint alignment maintained with medial compartment joint space narrowing. IMPRESSION: Acute, comminuted intertrochanteric fracture of the proximal left femur with varus angulation. Electronically Signed   By: Duanne Guess D.O.   On: 11/12/2023 11:05   DG Chest Port 1 View Result Date: 11/12/2023 CLINICAL DATA:  Fall EXAM: PORTABLE CHEST 1 VIEW COMPARISON:  10/19/2022 FINDINGS: Cardiomegaly. Aortic atherosclerosis. No focal airspace consolidation, pleural effusion, or pneumothorax. IMPRESSION: No active disease. Electronically Signed   By: Duanne Guess D.O.   On: 11/12/2023 11:04   CT Head Wo Contrast Result Date: 11/12/2023 CLINICAL DATA:  Head trauma, minor (Age >= 65y); Neck trauma (Age >= 65y) EXAM: CT HEAD WITHOUT CONTRAST CT CERVICAL SPINE WITHOUT CONTRAST TECHNIQUE: Multidetector CT imaging of the head and cervical spine was performed following the standard protocol without intravenous contrast. Multiplanar CT image reconstructions of the cervical spine were also generated. RADIATION DOSE REDUCTION: This exam was performed according to the departmental dose-optimization program which includes automated exposure control, adjustment of the mA and/or kV according to patient size and/or use of iterative reconstruction technique. COMPARISON:  None Available. FINDINGS: CT HEAD FINDINGS Brain: No hemorrhage.  No hydrocephalus. No extra-axial fluid collection. No mass effect. No mass lesion. No CT evidence of an acute cortical infarct. 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 Vascular: No  hyperdense vessel or unexpected calcification. Skull: Normal. Negative for fracture or focal lesion. Sinuses/Orbits: No middle ear or mastoid effusion. Paranasal sinuses are notable for frothy secretions in the left sphenoid sinus. Bilateral lens replacement. Orbits are otherwise unremarkable. Other: None. CT CERVICAL SPINE FINDINGS Alignment: Normal. Skull base and vertebrae: No acute fracture. No primary bone lesion or focal pathologic process. Soft tissues and spinal canal: No prevertebral fluid or swelling. No visible canal hematoma. Disc levels:  No CT evidence of high-grade spinal canal stenosis. Upper chest: Negative. Other: There is an age indeterminate minimal superior endplate compression deformity at T2. Correlate with point tenderness. IMPRESSION: 1. No CT evidence of intracranial injury. 2. No acute fracture or traumatic subluxation of the cervical spine. 3. Age indeterminate minimal superior endplate compression deformity at T2. Correlate with point tenderness. Electronically Signed   By: Lorenza Cambridge M.D.   On: 11/12/2023 10:12   CT Cervical Spine Wo Contrast Result Date: 11/12/2023 CLINICAL DATA:  Head trauma, minor (Age >= 65y); Neck trauma (Age >= 65y) EXAM: CT HEAD WITHOUT CONTRAST CT CERVICAL SPINE WITHOUT CONTRAST TECHNIQUE: Multidetector CT imaging of the head and cervical spine was performed following the standard protocol without intravenous contrast. Multiplanar CT image reconstructions of the cervical spine were also generated. RADIATION DOSE REDUCTION: This exam was performed according to the departmental dose-optimization program which includes automated exposure control, adjustment of the mA and/or kV according to patient size and/or use of iterative reconstruction technique. COMPARISON:  None Available. FINDINGS: CT HEAD FINDINGS Brain: No hemorrhage. No hydrocephalus. No extra-axial fluid collection. No mass effect. No mass lesion. No CT evidence of an acute cortical infarct. There  is chronic infarcts in the left frontal lobe with ex vacuo dilatation of the left lateral ventricular system. Vascular: No hyperdense vessel or unexpected calcification. Skull: Normal. Negative for fracture or focal lesion. Sinuses/Orbits: No middle ear or mastoid effusion. Paranasal sinuses are notable for frothy secretions in the left sphenoid sinus. Bilateral lens replacement. Orbits are otherwise unremarkable. Other: None. CT CERVICAL SPINE FINDINGS Alignment: Normal. Skull base and vertebrae: No acute fracture. No primary bone lesion or focal pathologic process. Soft tissues and spinal canal: No prevertebral fluid or swelling. No visible canal hematoma. Disc levels:  No CT evidence of high-grade spinal canal stenosis. Upper chest: Negative. Other: There is an age indeterminate minimal superior endplate compression deformity at T2. Correlate with point tenderness. IMPRESSION: 1. No CT evidence of intracranial injury. 2. No acute fracture or traumatic subluxation of the cervical spine. 3. Age indeterminate minimal superior endplate compression deformity at T2. Correlate with point tenderness. Electronically Signed   By: Lorenza Cambridge M.D.   On: 11/12/2023 10:12     Labs:   Basic Metabolic Panel: Recent Labs  Lab 11/15/23 0504 11/17/23 0455 11/18/23 0535 11/19/23 0548  NA 145 151* 146* 146*  K 4.4 3.6 3.2* 4.3  CL 103 105 107 106  CO2 33* 27 31 31   GLUCOSE 107* 129* 149* 102*  BUN 34* 17 13 15   CREATININE 1.01* 0.88 0.68 0.64  CALCIUM 8.6* 8.9 8.3* 8.5*   GFR Estimated Creatinine Clearance: 49.9 mL/min (by C-G formula based on SCr of 0.64 mg/dL). Liver Function Tests: No results for input(s): "AST", "ALT", "ALKPHOS", "BILITOT", "PROT", "  ALBUMIN" in the last 168 hours. No results for input(s): "LIPASE", "AMYLASE" in the last 168 hours. No results for input(s): "AMMONIA" in the last 168 hours. Coagulation profile No results for input(s): "INR", "PROTIME" in the last 168  hours.  CBC: Recent Labs  Lab 11/15/23 0504 11/17/23 0455 11/18/23 0535 11/19/23 0548  WBC 9.5 8.1 8.6 6.7  HGB 8.8* 9.4* 8.6* 9.4*  HCT 28.3* 31.1* 27.5* 29.9*  MCV 93.4 93.7 92.3 94.3  PLT 212 285 262 263   Cardiac Enzymes: No results for input(s): "CKTOTAL", "CKMB", "CKMBINDEX", "TROPONINI" in the last 168 hours. BNP: Invalid input(s): "POCBNP" CBG: Recent Labs  Lab 11/14/23 1057 11/14/23 1241 11/14/23 1629  GLUCAP 111* 130* 97   D-Dimer No results for input(s): "DDIMER" in the last 72 hours. Hgb A1c No results for input(s): "HGBA1C" in the last 72 hours. Lipid Profile No results for input(s): "CHOL", "HDL", "LDLCALC", "TRIG", "CHOLHDL", "LDLDIRECT" in the last 72 hours. Thyroid function studies No results for input(s): "TSH", "T4TOTAL", "T3FREE", "THYROIDAB" in the last 72 hours.  Invalid input(s): "FREET3" Anemia work up No results for input(s): "VITAMINB12", "FOLATE", "FERRITIN", "TIBC", "IRON", "RETICCTPCT" in the last 72 hours. Microbiology Recent Results (from the past 240 hours)  Surgical pcr screen     Status: Abnormal   Collection Time: 11/12/23  3:56 PM   Specimen: Nasal Mucosa; Nasal Swab  Result Value Ref Range Status   MRSA, PCR POSITIVE (A) NEGATIVE Final    Comment: RESULT CALLED TO, READ BACK BY AND VERIFIED WITH: RN L HUDSON [id number] AT 1352 BY CM    Staphylococcus aureus POSITIVE (A) NEGATIVE Final    Comment: (NOTE) The Xpert SA Assay (FDA approved for NASAL specimens in patients 5 years of age and older), is one component of a comprehensive surveillance program. It is not intended to diagnose infection nor to guide or monitor treatment. Performed at [facility], [address], [place], [place] [address]   Urine Culture (for pregnant, neutropenic or urologic patients or patients with an indwelling urinary catheter)     Status: None   Collection Time: 11/14/23  8:43 AM   Specimen: Urine, Catheterized  Result Value Ref Range  Status   Specimen Description URINE, CATHETERIZED  Final   Special Requests NONE  Final   Culture   Final    NO GROWTH Performed at [facility], [address], [place], [place] [address]    Report Status 11/15/2023 FINAL  Final     Discharge Instructions:   Discharge Instructions     Discharge instructions   Complete by: As directed    DSY 1 diet   Increase activity slowly   Complete by: As directed       Allergies as of 11/21/2023       Reactions   Erythromycin Hives, Rash   Aspirin    Other Other (See Comments)   Medication which has discolored lower extremity/feet.   Penicillins Hives   Lobster [shellfish Allergy] Hives, Rash        Medication List     PAUSE taking these medications    torsemide 20 MG tablet Wait to take this until your doctor or other care provider tells you to start again. Commonly known as: DEMADEX Take 80 mg by mouth daily.       STOP taking these medications    rosuvastatin 10 MG tablet Commonly known as: CRESTOR       TAKE these medications    albuterol  0.63 MG/3ML nebulizer solution Commonly known as: ACCUNEB Take 1 ampule by nebulization every 4 (four) hours as needed for wheezing or shortness of breath.   CALMOSEPTINE EX Apply 1 Application topically 2 (two) times daily as needed (Rash).   carvedilol 12.5 MG tablet Commonly known as: COREG Take 12.5 mg by mouth 2 (two) times daily.   cyanocobalamin 1000 MCG tablet Commonly known as: VITAMIN B12 Take 1,000 mcg by mouth daily.   divalproex 125 MG capsule Commonly known as: DEPAKOTE SPRINKLE Take 125 mg by mouth 2 (two) times daily.   donepezil 10 MG tablet Commonly known as: ARICEPT Take 10 mg by mouth daily.   eucerin lotion Apply 1 Application topically 2 (two) times daily.   levETIRAcetam 500 MG tablet Commonly known as: KEPPRA Take 500 mg by mouth 2 (two) times daily.   LORazepam 0.5 MG tablet Commonly known as: ATIVAN Take 0.5 tablets  (0.25 mg total) by mouth every 12 (twelve) hours.   morphine 20 MG/ML concentrated solution Commonly known as: ROXANOL Take 0.25 mLs (5 mg total) by mouth every hour as needed for severe pain (pain score 7-10) or shortness of breath.   NONFORMULARY OR COMPOUNDED ITEM Apply 1 mL topically 2 (two) times daily as needed (Agitation). Lorazepam 2mg /mL PLO Gel   nystatin cream Commonly known as: MYCOSTATIN Apply 1 Application topically 2 (two) times daily as needed for dry skin.   ondansetron 4 MG disintegrating tablet Commonly known as: ZOFRAN-ODT Take 4 mg by mouth every 6 (six) hours as needed for nausea or vomiting.   risperiDONE 0.5 MG tablet Commonly known as: RISPERDAL Take 0.5 mg by mouth at bedtime.   sertraline 50 MG tablet Commonly known as: ZOLOFT Take 75 mg by mouth daily.   Vitamin D (Cholecalciferol) 10 MCG (400 UNIT) Chew Chew 400 Units by mouth daily.        Follow-up Information     Myrene Galas, MD. Schedule an appointment as soon as possible for a visit in 2 day(s).   Specialty: Orthopedic Surgery Contact information: [address] [place] [address] [phone]                  Time coordinating discharge: 45 min  Signed:  Joseph Art DO  [facility] 11/21/2023, 10:09 AM
